# Patient Record
Sex: Female | Born: 1965 | Race: Black or African American | Hispanic: No | Marital: Married | Smoking: Never smoker
Health system: Southern US, Community
[De-identification: ages and names within clinical notes are randomized; demographics above are authoritative.]

## PROBLEM LIST (undated history)

## (undated) DIAGNOSIS — D649 Anemia, unspecified: Secondary | ICD-10-CM

## (undated) HISTORY — PX: ABDOMINAL HYSTERECTOMY: SHX81

## (undated) HISTORY — PX: APPENDECTOMY: SHX54

---

## 2014-09-16 DIAGNOSIS — D259 Leiomyoma of uterus, unspecified: Secondary | ICD-10-CM | POA: Insufficient documentation

## 2014-10-03 DIAGNOSIS — Z90710 Acquired absence of both cervix and uterus: Secondary | ICD-10-CM | POA: Insufficient documentation

## 2016-09-27 ENCOUNTER — Emergency Department (HOSPITAL_COMMUNITY): Payer: Managed Care, Other (non HMO)

## 2016-09-27 ENCOUNTER — Encounter (HOSPITAL_COMMUNITY): Payer: Self-pay

## 2016-09-27 ENCOUNTER — Inpatient Hospital Stay (HOSPITAL_COMMUNITY): Payer: Managed Care, Other (non HMO)

## 2016-09-27 ENCOUNTER — Inpatient Hospital Stay (HOSPITAL_COMMUNITY)
Admission: EM | Admit: 2016-09-27 | Discharge: 2016-09-30 | DRG: 872 | Disposition: A | Discharge status: Home / Self Care | Payer: Managed Care, Other (non HMO) | Attending: Nephrology | Admitting: Nephrology

## 2016-09-27 DIAGNOSIS — D696 Thrombocytopenia, unspecified: Secondary | ICD-10-CM | POA: Diagnosis present

## 2016-09-27 DIAGNOSIS — N10 Acute tubulo-interstitial nephritis: Secondary | ICD-10-CM | POA: Diagnosis present

## 2016-09-27 DIAGNOSIS — N390 Urinary tract infection, site not specified: Secondary | ICD-10-CM | POA: Diagnosis not present

## 2016-09-27 DIAGNOSIS — R112 Nausea with vomiting, unspecified: Secondary | ICD-10-CM | POA: Diagnosis present

## 2016-09-27 DIAGNOSIS — M549 Dorsalgia, unspecified: Secondary | ICD-10-CM | POA: Diagnosis present

## 2016-09-27 DIAGNOSIS — R000 Tachycardia, unspecified: Secondary | ICD-10-CM | POA: Diagnosis present

## 2016-09-27 DIAGNOSIS — A419 Sepsis, unspecified organism: Secondary | ICD-10-CM | POA: Diagnosis not present

## 2016-09-27 DIAGNOSIS — R42 Dizziness and giddiness: Secondary | ICD-10-CM | POA: Diagnosis present

## 2016-09-27 DIAGNOSIS — R319 Hematuria, unspecified: Secondary | ICD-10-CM | POA: Diagnosis present

## 2016-09-27 DIAGNOSIS — I959 Hypotension, unspecified: Secondary | ICD-10-CM | POA: Diagnosis present

## 2016-09-27 LAB — CBC WITH DIFFERENTIAL/PLATELET
Basophils Absolute: 0 10*3/uL (ref 0.0–0.1)
Basophils Relative: 0 %
Eosinophils Absolute: 0 10*3/uL (ref 0.0–0.7)
Eosinophils Relative: 0 %
HEMATOCRIT: 39.6 % (ref 36.0–46.0)
HEMOGLOBIN: 13.4 g/dL (ref 12.0–15.0)
LYMPHS ABS: 1.4 10*3/uL (ref 0.7–4.0)
Lymphocytes Relative: 12 %
MCH: 27.9 pg (ref 26.0–34.0)
MCHC: 33.8 g/dL (ref 30.0–36.0)
MCV: 82.5 fL (ref 78.0–100.0)
MONO ABS: 1.3 10*3/uL — AB (ref 0.1–1.0)
MONOS PCT: 10 %
NEUTROS ABS: 9.6 10*3/uL — AB (ref 1.7–7.7)
NEUTROS PCT: 78 %
Platelets: 139 10*3/uL — ABNORMAL LOW (ref 150–400)
RBC: 4.8 MIL/uL (ref 3.87–5.11)
RDW: 13.1 % (ref 11.5–15.5)
WBC: 12.3 10*3/uL — ABNORMAL HIGH (ref 4.0–10.5)

## 2016-09-27 LAB — COMPREHENSIVE METABOLIC PANEL
ALBUMIN: 3.2 g/dL — AB (ref 3.5–5.0)
ALK PHOS: 56 U/L (ref 38–126)
ALT: 16 U/L (ref 14–54)
ANION GAP: 8 (ref 5–15)
AST: 21 U/L (ref 15–41)
BUN: 8 mg/dL (ref 6–20)
CHLORIDE: 101 mmol/L (ref 101–111)
CO2: 26 mmol/L (ref 22–32)
Calcium: 8.8 mg/dL — ABNORMAL LOW (ref 8.9–10.3)
Creatinine, Ser: 1.25 mg/dL — ABNORMAL HIGH (ref 0.44–1.00)
GFR calc non Af Amer: 49 mL/min — ABNORMAL LOW (ref >60)
GFR, EST AFRICAN AMERICAN: 57 mL/min — AB (ref >60)
GLUCOSE: 131 mg/dL — AB (ref 65–99)
Potassium: 4.3 mmol/L (ref 3.5–5.1)
SODIUM: 135 mmol/L (ref 135–145)
Total Bilirubin: 0.9 mg/dL (ref 0.3–1.2)
Total Protein: 6.7 g/dL (ref 6.5–8.1)

## 2016-09-27 LAB — URINALYSIS, ROUTINE W REFLEX MICROSCOPIC
Bilirubin Urine: NEGATIVE
GLUCOSE, UA: NEGATIVE mg/dL
KETONES UR: NEGATIVE mg/dL
Nitrite: NEGATIVE
PROTEIN: 30 mg/dL — AB
SQUAMOUS EPITHELIAL / LPF: NONE SEEN
Specific Gravity, Urine: 1.006 (ref 1.005–1.030)
pH: 5 (ref 5.0–8.0)

## 2016-09-27 LAB — I-STAT CG4 LACTIC ACID, ED
LACTIC ACID, VENOUS: 1.44 mmol/L (ref 0.5–1.9)
LACTIC ACID, VENOUS: 0.94 mmol/L (ref 0.5–1.9)

## 2016-09-27 MED ORDER — ACETAMINOPHEN 325 MG PO TABS
650.0000 mg | ORAL_TABLET | Freq: Once | ORAL | Status: AC | PRN
  Administered 2016-09-27: 650 mg via ORAL

## 2016-09-27 MED ORDER — KETOROLAC TROMETHAMINE 30 MG/ML IJ SOLN
30.0000 mg | Freq: Once | INTRAMUSCULAR | Status: AC
  Administered 2016-09-27: 30 mg via INTRAVENOUS
  Filled 2016-09-27: qty 1

## 2016-09-27 MED ORDER — ACETAMINOPHEN 325 MG PO TABS
ORAL_TABLET | ORAL | Status: AC
  Filled 2016-09-27: qty 2

## 2016-09-27 MED ORDER — DEXTROSE 5 % IV SOLN
1.0000 g | Freq: Once | INTRAVENOUS | Status: AC
  Administered 2016-09-27: 1 g via INTRAVENOUS
  Filled 2016-09-27: qty 10

## 2016-09-27 MED ORDER — ONDANSETRON HCL 4 MG/2ML IJ SOLN
4.0000 mg | Freq: Once | INTRAMUSCULAR | Status: AC
  Administered 2016-09-27: 4 mg via INTRAVENOUS
  Filled 2016-09-27: qty 2

## 2016-09-27 NOTE — ED Notes (Signed)
Patient taken to CT.

## 2016-09-27 NOTE — H&P (Addendum)
[name] KWI:[id number] DOB: [birth year] DOA: 09/27/2016     PCP: Patient, No Pcp Per   Outpatient Specialists: none Patient coming from:   home Lives With family   Chief Complaint: Nausea vomiting fever suprapubic pain  HPI: Lauren Glenn is a 51 y.o. female with no significant  medical history      Presented with 5-7 days of worsening suprapubic pain dysuria. Today significant hematuria worsening back pain  she presented to [facility] and was diagnosed with kidney infection sent to emergency department to have IV antibiotics she have had associated nausea  patient has recently moved here have not establish PCP care  Symptoms have been getting worse now she started to have fevers as well. Denies hx of UTI's or kidney stones in the past.     IN ER:  Temp (24hrs), Avg:102.8 F (39.3 C), Min:102.8 F (39.3 C), Max:102.8 F (39.3 C)    RR 17 98% Hr 89 down from 104 BP 108/64 up from 95/53 Lactic acid 1.44 Na 135 K 4.3 Cr 1.25 Alb 3.2 WBC 12.3 Hg 13.4 plt 139 CXR negative Following Medications were ordered in ER: Medications  acetaminophen (TYLENOL) tablet 650 mg (650 mg Oral Given 09/27/16 1642)  cefTRIAXone (ROCEPHIN) 1 g in dextrose 5 % 50 mL IVPB (0 g Intravenous Stopped 09/27/16 2041)  ketorolac (TORADOL) 30 MG/ML injection 30 mg (30 mg Intravenous Given 09/27/16 2140)  ondansetron (ZOFRAN) injection 4 mg (4 mg Intravenous Given 09/27/16 2140)     Hospitalist was called for admission forSepsis secondary to Pyelonephritis    Review of Systems:    Pertinent positives include: Fevers, chills, fatigue, abdominal pain, nausea, dysuria, hematuria  Constitutional:  No weight loss, night sweats, weight loss  HEENT:  No headaches, Difficulty swallowing,Tooth/dental problems,Sore throat,  No sneezing, itching, ear ache, nasal congestion, post nasal drip,  Cardio-vascular:  No chest pain, Orthopnea, PND, anasarca, dizziness, palpitations.no Bilateral lower extremity  swelling  GI:  No heartburn, indigestion,  vomiting, diarrhea, change in bowel habits, loss of appetite, melena, blood in stool, hematemesis Resp:  no shortness of breath at rest. No dyspnea on exertion, No excess mucus, no productive cough, No non-productive cough, No coughing up of blood.No change in color of mucus.No wheezing. Skin:  no rash or lesions. No jaundice GU:  no change in color of urine, no urgency or frequency. No straining to urinate.  No flank pain.  Musculoskeletal:  No joint pain or no joint swelling. No decreased range of motion. No back pain.  Psych:  No change in mood or affect. No depression or anxiety. No memory loss.  Neuro: no localizing neurological complaints, no tingling, no weakness, no double vision, no gait abnormality, no slurred speech, no confusion  As per HPI otherwise 10 point review of systems negative.   Past Medical History: History reviewed. No pertinent past medical history. Past Surgical History:  Procedure Laterality Date  . ABDOMINAL HYSTERECTOMY    . APPENDECTOMY       Social History:  Ambulatory  independently     reports that she has never smoked. She has never used smokeless tobacco. She reports that she does not drink alcohol or use drugs.  Allergies:  No Known Allergies     Family History:   Family History  Problem Relation Age of Onset  . Cancer Other   . CAD Neg Hx   . Stroke Neg Hx   . Diabetes Neg Hx     Medications: Prior to  Admission medications   Not on File    Physical Exam: Patient Vitals for the past 24 hrs:  BP Temp Temp src Pulse Resp SpO2  09/27/16 2100 108/64 - - 89 - 98 %  09/27/16 2000 (!) 95/53 - - 80 - 99 %  09/27/16 1954 (!) 111/58 - - 85 17 100 %  09/27/16 1635 104/62 (!) 102.8 F (39.3 C) Oral (!) 104 18 93 %    1. General:  in No Acute distress 2. Psychological: Alert and  Oriented 3. Head/ENT:    Dry Mucous Membranes                          Head Non traumatic, neck supple                           Normal   Dentition 4. SKIN:   decreased Skin turgor,  Skin clean Dry and intact no rash 5. Heart: Regular rate and rhythm no   Murmur, Rub or gallop 6. Lungs:  Clear to auscultation bilaterally, no wheezes or crackles   7. Abdomen: Soft, suprapubic tenderness, Non distended 8. Lower extremities: no clubbing, cyanosis, or edema 9. Neurologically Grossly intact, moving all 4 extremities equally   10. MSK: Normal range of motion   body mass index is unknown because there is no height or weight on file.  Labs on Admission:   Labs on Admission: I have personally reviewed following labs and imaging studies  CBC:  Recent Labs Lab 09/27/16 1600  WBC 12.3*  NEUTROABS 9.6*  HGB 13.4  HCT 39.6  MCV 82.5  PLT 789*   Basic Metabolic Panel:  Recent Labs Lab 09/27/16 1600  NA 135  K 4.3  CL 101  CO2 26  GLUCOSE 131*  BUN 8  CREATININE 1.25*  CALCIUM 8.8*   GFR: CrCl cannot be calculated (Unknown ideal weight.). Liver Function Tests:  Recent Labs Lab 09/27/16 1600  AST 21  ALT 16  ALKPHOS 56  BILITOT 0.9  PROT 6.7  ALBUMIN 3.2*   No results for input(s): LIPASE, AMYLASE in the last 168 hours. No results for input(s): AMMONIA in the last 168 hours. Coagulation Profile: No results for input(s): INR, PROTIME in the last 168 hours. Cardiac Enzymes: No results for input(s): CKTOTAL, CKMB, CKMBINDEX, TROPONINI in the last 168 hours. BNP (last 3 results) No results for input(s): PROBNP in the last 8760 hours. HbA1C: No results for input(s): HGBA1C in the last 72 hours. CBG: No results for input(s): GLUCAP in the last 168 hours. Lipid Profile: No results for input(s): CHOL, HDL, LDLCALC, TRIG, CHOLHDL, LDLDIRECT in the last 72 hours. Thyroid Function Tests: No results for input(s): TSH, T4TOTAL, FREET4, T3FREE, THYROIDAB in the last 72 hours. Anemia Panel: No results for input(s): VITAMINB12, FOLATE, FERRITIN, TIBC, IRON, RETICCTPCT in the last 72  hours. Urine analysis:    Component Value Date/Time   COLORURINE YELLOW 09/27/2016 1954   APPEARANCEUR CLOUDY (A) 09/27/2016 1954   LABSPEC 1.006 09/27/2016 1954   PHURINE 5.0 09/27/2016 1954   GLUCOSEU NEGATIVE 09/27/2016 1954   HGBUR MODERATE (A) 09/27/2016 [place] NEGATIVE 09/27/2016 [place] NEGATIVE 09/27/2016 1954   PROTEINUR 30 (A) 09/27/2016 1954   NITRITE NEGATIVE 09/27/2016 1954   LEUKOCYTESUR LARGE (A) 09/27/2016 1954   Sepsis Labs: @LABRCNTIP (procalcitonin:4,lacticidven:4) ) Recent Results (from the past 240 hour(s))  Culture, blood (routine x 2)  Status: None (Preliminary result)   Collection Time: 09/27/16  7:58 PM  Result Value Ref Range Status   Specimen Description BLOOD RIGHT ARM  Final   Special Requests   Final    BOTTLES DRAWN AEROBIC AND ANAEROBIC Blood Culture adequate volume   Culture PENDING  Incomplete   Report Status PENDING  Incomplete     UA  evidence of UTI      No results found for: HGBA1C  CrCl cannot be calculated (Unknown ideal weight.).  BNP (last 3 results) No results for input(s): PROBNP in the last 8760 hours.   ECG REPORT Not obtained  There were no vitals filed for this visit.   Cultures:    Component Value Date/Time   SDES BLOOD RIGHT ARM 09/27/2016 1958   SPECREQUEST  09/27/2016 1958    BOTTLES DRAWN AEROBIC AND ANAEROBIC Blood Culture adequate volume   CULT PENDING 09/27/2016 1958   REPTSTATUS PENDING 09/27/2016 1958     Radiological Exams on Admission: Dg Chest 2 View  Result Date: 09/27/2016 CLINICAL DATA:  Kidney infection. EXAM: CHEST  2 VIEW COMPARISON:  None. FINDINGS: The heart size and mediastinal contours are within normal limits. Both lungs are clear. Scoliosis deformity involving the thoracic spine is convex towards the right. IMPRESSION: No active cardiopulmonary disease. Electronically Signed   By: Kerby Moors M.D.   On: 09/27/2016 17:27   Ct Renal Stone Study  Result Date:  09/27/2016 CLINICAL DATA:  Urinary tract infection. Hematuria. History of appendectomy and abdominal hysterectomy. EXAM: CT ABDOMEN AND PELVIS WITHOUT CONTRAST TECHNIQUE: Multidetector CT imaging of the abdomen and pelvis was performed following the standard protocol without IV contrast. COMPARISON:  None. FINDINGS: Lower chest: Mild atelectasis in the lung bases. Hepatobiliary: No focal liver abnormality is seen. No gallstones, gallbladder wall thickening, or biliary dilatation. Pancreas: Unremarkable. No pancreatic ductal dilatation or surrounding inflammatory changes. Spleen: Normal in size without focal abnormality. Adrenals/Urinary Tract: No adrenal gland nodules. No hydronephrosis or hydroureter. There is stranding around the right kidney and right ureter with mild diffuse bladder wall thickening. Changes suggest cystitis with right pyelonephritis. No abscess. Stomach/Bowel: Stomach, small bowel, and colon are mostly decompressed. Appendix is surgically absent. Vascular/Lymphatic: No significant vascular findings are present. No enlarged abdominal or pelvic lymph nodes. Reproductive: Status post hysterectomy. No adnexal masses. Other: Minimal midline incisional anterior pelvic wall hernia containing fat. No free air or free fluid in the abdomen. Musculoskeletal: Degenerative changes in the spine. No destructive bone lesions. IMPRESSION: Bladder wall thickening with stranding around the right kidney. Changes suggest pyelonephritis and cystitis. No renal or ureteral stone or obstruction identified. Electronically Signed   By: Lucienne Capers M.D.   On: 09/27/2016 23:36    Chart has been reviewed    Assessment/Plan  51 y.o. female with no significant  medical history  Presents with sepsis likely due to pyelonephritis     Present on Admission: . Acute pyelonephritis obtain CT renal protocol to evaluate for possible abscess vs renal stone Given hypotension will admit to step down . Sepsis secondary  to UTI ([place]) - Admit per Sepsis protocol likely source being  UTI,   - rehydrate with 18ml/kg  - initiate broad spectrum antibiotics Rocephin  -  obtain blood cultures  - Obtain serial lactic acid  - Obtain procalcitonin level  - Admit and monitor vital signs closely  -   Sepsis - Repeat Assessment  Performed at:    10:58 PM  Vitals     Blood  pressure (!) 99/54, pulse 96, temperature (!) 102.8 F (39.3 C), temperature source Oral, resp. rate 17, SpO2 96 %.  Heart:     Regular rate and rhythm  Lungs:    CTA  Capillary Refill:   <2 sec  Peripheral Pulse:   Radial pulse palpable  Skin:     Normal Color      Other plan as per orders.  DVT prophylaxis:  SCD     Code Status:  FULL CODE  as per patient    Family Communication:   Family  at  Bedside  plan of care was discussed with Husband  Disposition Plan:    To home once workup is complete and patient is stable                              Consults called: none  Admission status:    obs   Level of care          SDU      I have spent a total of 33min on this admission   [place] 09/27/2016, 11:47 PM    [facility]  Pager [phone]   after 2 AM please page floor coverage PA If 7AM-7PM, please contact the day team taking care of the patient  [email or web address]  Password [email or web address]

## 2016-09-27 NOTE — ED Triage Notes (Signed)
Pt reports she was sent here by  Dr. Barnett Abu from [facility] for a kidney infection. She states she has noticed hematuria, abdominal pain and back pain today.

## 2016-09-27 NOTE — ED Provider Notes (Signed)
[facility] DEPT Provider Note   CSN: [id number] Arrival date & time: 09/27/16  1624     History   Chief Complaint Chief Complaint  Patient presents with  . Hematuria    HPI Lauren Glenn is a 51 y.o. female.  Patient is a 51 year old female with history of prior hysterectomy. She presents for evaluation of blood in the urine and lower abdominal discomfort. This is been ongoing for the past several days. She was seen at [facility] and told she had a urinary tract infection. She was then sent here for IV antibiotics and possible admission. She denies any vomiting.    Hematuria  This is a new problem. The current episode started 2 days ago. The problem occurs constantly. The problem has been gradually worsening. Associated symptoms include abdominal pain. Nothing aggravates the symptoms. Nothing relieves the symptoms. She has tried nothing for the symptoms.    History reviewed. No pertinent past medical history.  There are no active problems to display for this patient.   Past Surgical History:  Procedure Laterality Date  . ABDOMINAL HYSTERECTOMY    . APPENDECTOMY      OB History    No data available       Home Medications    Prior to Admission medications   Not on File    Family History No family history on file.  Social History Social History  Substance Use Topics  . Smoking status: Never Smoker  . Smokeless tobacco: Never Used  . Alcohol use No     Allergies   Patient has no allergy information on record.   Review of Systems Review of Systems  Gastrointestinal: Positive for abdominal pain.  Genitourinary: Positive for hematuria.  All other systems reviewed and are negative.    Physical Exam Updated Vital Signs BP (!) 111/58 (BP Location: Left Arm)   Pulse 85   Temp (!) 102.8 F (39.3 C) (Oral)   Resp 17   SpO2 100%   Physical Exam  Constitutional: She is oriented to person, place, and time. She appears well-developed and  well-nourished. No distress.  HENT:  Head: Normocephalic and atraumatic.  Neck: Normal range of motion. Neck supple.  Cardiovascular: Normal rate and regular rhythm.  Exam reveals no gallop and no friction rub.   No murmur heard. Pulmonary/Chest: Effort normal and breath sounds normal. No respiratory distress. She has no wheezes.  Abdominal: Soft. Bowel sounds are normal. She exhibits no distension. There is tenderness. There is no rebound and no guarding.  There is mild tenderness in the suprapubic region.  Musculoskeletal: Normal range of motion.  Neurological: She is alert and oriented to person, place, and time.  Skin: Skin is warm and dry. She is not diaphoretic.  Nursing note and vitals reviewed.    ED Treatments / Results  Labs (all labs ordered are listed, but only abnormal results are displayed) Labs Reviewed  COMPREHENSIVE METABOLIC PANEL - Abnormal; Notable for the following:       Result Value   Glucose, Bld 131 (*)    Creatinine, Ser 1.25 (*)    Calcium 8.8 (*)    Albumin 3.2 (*)    GFR calc non Af Amer 49 (*)    GFR calc Af Amer 57 (*)    All other components within normal limits  CBC WITH DIFFERENTIAL/PLATELET - Abnormal; Notable for the following:    WBC 12.3 (*)    Platelets 139 (*)    Neutro Abs 9.6 (*)  Monocytes Absolute 1.3 (*)    All other components within normal limits  CULTURE, BLOOD (ROUTINE X 2)  CULTURE, BLOOD (ROUTINE X 2)  URINALYSIS, ROUTINE W REFLEX MICROSCOPIC  I-STAT CG4 LACTIC ACID, ED  I-STAT CG4 LACTIC ACID, ED  I-STAT CG4 LACTIC ACID, ED    EKG  EKG Interpretation None       Radiology Dg Chest 2 View  Result Date: 09/27/2016 CLINICAL DATA:  Kidney infection. EXAM: CHEST  2 VIEW COMPARISON:  None. FINDINGS: The heart size and mediastinal contours are within normal limits. Both lungs are clear. Scoliosis deformity involving the thoracic spine is convex towards the right. IMPRESSION: No active cardiopulmonary disease.  Electronically Signed   By: Kerby Moors M.D.   On: 09/27/2016 17:27    Procedures Procedures (including critical care time)  Medications Ordered in ED Medications  acetaminophen (TYLENOL) 325 MG tablet (not administered)  cefTRIAXone (ROCEPHIN) 1 g in dextrose 5 % 50 mL IVPB (not administered)  acetaminophen (TYLENOL) tablet 650 mg (650 mg Oral Given 09/27/16 1642)     Initial Impression / Assessment and Plan / ED Course  I have reviewed the triage vital signs and the nursing notes.  Pertinent labs & imaging results that were available during my care of the patient were reviewed by me and considered in my medical decision making (see chart for details).  Patient sent from [facility] hospital for admission for treatment of urinary tract infection. She was evaluated there and found to have a UTI, elevated white count.  Urinalysis shows a UTI and labs show a white count of 12.4. She was given Rocephin here in the ER and will be admitted to the hospitalist service. Dr. Roel Cluck agrees to admit. She was given the option for discharge with oral antibiotics, however she reports that she feels too nauseated and too sick to go home.  Final Clinical Impressions(s) / ED Diagnoses   Final diagnoses:  None    New Prescriptions New Prescriptions   No medications on file     Veryl Speak, MD 09/27/16 2323

## 2016-09-28 DIAGNOSIS — R000 Tachycardia, unspecified: Secondary | ICD-10-CM | POA: Diagnosis present

## 2016-09-28 DIAGNOSIS — I959 Hypotension, unspecified: Secondary | ICD-10-CM | POA: Diagnosis present

## 2016-09-28 DIAGNOSIS — N390 Urinary tract infection, site not specified: Secondary | ICD-10-CM | POA: Diagnosis not present

## 2016-09-28 DIAGNOSIS — A419 Sepsis, unspecified organism: Secondary | ICD-10-CM | POA: Diagnosis present

## 2016-09-28 DIAGNOSIS — R42 Dizziness and giddiness: Secondary | ICD-10-CM | POA: Diagnosis present

## 2016-09-28 DIAGNOSIS — R112 Nausea with vomiting, unspecified: Secondary | ICD-10-CM | POA: Diagnosis present

## 2016-09-28 DIAGNOSIS — N10 Acute tubulo-interstitial nephritis: Secondary | ICD-10-CM | POA: Diagnosis present

## 2016-09-28 DIAGNOSIS — D696 Thrombocytopenia, unspecified: Secondary | ICD-10-CM | POA: Diagnosis present

## 2016-09-28 DIAGNOSIS — M549 Dorsalgia, unspecified: Secondary | ICD-10-CM | POA: Diagnosis present

## 2016-09-28 DIAGNOSIS — R319 Hematuria, unspecified: Secondary | ICD-10-CM | POA: Diagnosis present

## 2016-09-28 LAB — LACTIC ACID, PLASMA
LACTIC ACID, VENOUS: 1 mmol/L (ref 0.5–1.9)
Lactic Acid, Venous: 1.2 mmol/L (ref 0.5–1.9)

## 2016-09-28 LAB — CBC WITH DIFFERENTIAL/PLATELET
BASOS PCT: 0 %
Eosinophils Relative: 0 %
HCT: 38 % (ref 36.0–46.0)
HEMOGLOBIN: 13 g/dL (ref 12.0–15.0)
LYMPHS PCT: 13 %
MCH: 28.4 pg (ref 26.0–34.0)
MCHC: 34.2 g/dL (ref 30.0–36.0)
MCV: 83 fL (ref 78.0–100.0)
Monocytes Relative: 9 %
NEUTROS PCT: 78 %
Platelets: 120 10*3/uL — ABNORMAL LOW (ref 150–400)
RBC: 4.58 MIL/uL (ref 3.87–5.11)
RDW: 13.5 % (ref 11.5–15.5)
WBC: 10.8 10*3/uL — AB (ref 4.0–10.5)

## 2016-09-28 LAB — COMPREHENSIVE METABOLIC PANEL
ALT: 13 U/L — ABNORMAL LOW (ref 14–54)
AST: 22 U/L (ref 15–41)
Albumin: 3 g/dL — ABNORMAL LOW (ref 3.5–5.0)
Alkaline Phosphatase: 54 U/L (ref 38–126)
Anion gap: 9 (ref 5–15)
BILIRUBIN TOTAL: 1.3 mg/dL — AB (ref 0.3–1.2)
BUN: 9 mg/dL (ref 6–20)
CO2: 23 mmol/L (ref 22–32)
Calcium: 7.8 mg/dL — ABNORMAL LOW (ref 8.9–10.3)
Chloride: 103 mmol/L (ref 101–111)
Creatinine, Ser: 1.34 mg/dL — ABNORMAL HIGH (ref 0.44–1.00)
GFR, EST AFRICAN AMERICAN: 52 mL/min — AB (ref >60)
GFR, EST NON AFRICAN AMERICAN: 45 mL/min — AB (ref >60)
Glucose, Bld: 116 mg/dL — ABNORMAL HIGH (ref 65–99)
POTASSIUM: 4.9 mmol/L (ref 3.5–5.1)
Sodium: 135 mmol/L (ref 135–145)
TOTAL PROTEIN: 5.9 g/dL — AB (ref 6.5–8.1)

## 2016-09-28 LAB — GLUCOSE, CAPILLARY
GLUCOSE-CAPILLARY: 101 mg/dL — AB (ref 65–99)
GLUCOSE-CAPILLARY: 99 mg/dL (ref 65–99)
Glucose-Capillary: 107 mg/dL — ABNORMAL HIGH (ref 65–99)

## 2016-09-28 LAB — TSH: TSH: 1.364 u[IU]/mL (ref 0.350–4.500)

## 2016-09-28 LAB — PROTIME-INR
INR: 1.21
PROTHROMBIN TIME: 15.3 s — AB (ref 11.4–15.2)

## 2016-09-28 LAB — PROCALCITONIN: Procalcitonin: 4.73 ng/mL

## 2016-09-28 LAB — MRSA PCR SCREENING: MRSA BY PCR: NEGATIVE

## 2016-09-28 LAB — HIV ANTIBODY (ROUTINE TESTING W REFLEX): HIV Screen 4th Generation wRfx: NONREACTIVE

## 2016-09-28 LAB — APTT: aPTT: 37 seconds — ABNORMAL HIGH (ref 24–36)

## 2016-09-28 LAB — MAGNESIUM: MAGNESIUM: 1.8 mg/dL (ref 1.7–2.4)

## 2016-09-28 LAB — PHOSPHORUS: PHOSPHORUS: 1.9 mg/dL — AB (ref 2.5–4.6)

## 2016-09-28 LAB — PREGNANCY, URINE: PREG TEST UR: NEGATIVE

## 2016-09-28 MED ORDER — SODIUM CHLORIDE 0.9% FLUSH
3.0000 mL | Freq: Two times a day (BID) | INTRAVENOUS | Status: DC
  Administered 2016-09-29 – 2016-09-30 (×3): 3 mL via INTRAVENOUS

## 2016-09-28 MED ORDER — SODIUM CHLORIDE 0.9 % IV SOLN
INTRAVENOUS | Status: DC
  Administered 2016-09-28: 01:00:00 via INTRAVENOUS

## 2016-09-28 MED ORDER — KETOROLAC TROMETHAMINE 30 MG/ML IJ SOLN
30.0000 mg | Freq: Four times a day (QID) | INTRAMUSCULAR | Status: DC | PRN
  Administered 2016-09-28: 30 mg via INTRAVENOUS
  Filled 2016-09-28: qty 1

## 2016-09-28 MED ORDER — HYDROCODONE-ACETAMINOPHEN 5-325 MG PO TABS: 1.0000 | ORAL_TABLET | ORAL | Status: DC | PRN

## 2016-09-28 MED ORDER — SODIUM CHLORIDE 0.9 % IV SOLN
INTRAVENOUS | Status: DC
  Administered 2016-09-28 – 2016-09-29 (×3): via INTRAVENOUS

## 2016-09-28 MED ORDER — SODIUM CHLORIDE 0.9 % IV BOLUS (SEPSIS)
1000.0000 mL | Freq: Once | INTRAVENOUS | Status: AC
  Administered 2016-09-28: 1000 mL via INTRAVENOUS

## 2016-09-28 MED ORDER — MAGNESIUM SULFATE 2 GM/50ML IV SOLN
2.0000 g | Freq: Once | INTRAVENOUS | Status: AC
  Administered 2016-09-28: 2 g via INTRAVENOUS
  Filled 2016-09-28: qty 50

## 2016-09-28 MED ORDER — ONDANSETRON HCL 4 MG/2ML IJ SOLN
4.0000 mg | Freq: Four times a day (QID) | INTRAMUSCULAR | Status: DC | PRN
  Administered 2016-09-28: 4 mg via INTRAVENOUS
  Filled 2016-09-28: qty 2

## 2016-09-28 MED ORDER — ONDANSETRON HCL 4 MG PO TABS: 4.0000 mg | ORAL_TABLET | Freq: Four times a day (QID) | ORAL | Status: DC | PRN

## 2016-09-28 MED ORDER — DEXTROSE 5 % IV SOLN
1.0000 g | INTRAVENOUS | Status: DC
  Administered 2016-09-29 – 2016-09-30 (×2): 1 g via INTRAVENOUS
  Filled 2016-09-28 (×2): qty 10

## 2016-09-28 MED ORDER — ACETAMINOPHEN 325 MG PO TABS
650.0000 mg | ORAL_TABLET | Freq: Four times a day (QID) | ORAL | Status: DC | PRN
  Administered 2016-09-28 (×2): 650 mg via ORAL
  Filled 2016-09-28 (×2): qty 2

## 2016-09-28 MED ORDER — SODIUM CHLORIDE 0.9 % IV BOLUS (SEPSIS)
500.0000 mL | Freq: Once | INTRAVENOUS | Status: AC
  Administered 2016-09-28: 500 mL via INTRAVENOUS

## 2016-09-28 MED ORDER — ACETAMINOPHEN 650 MG RE SUPP
650.0000 mg | Freq: Four times a day (QID) | RECTAL | Status: DC | PRN
Start: 2016-09-28 — End: 2016-09-30

## 2016-09-28 NOTE — Progress Notes (Signed)
Pharmacy Antibiotic Note  Lauren Glenn is a 51 y.o. female admitted on 09/27/2016 with UTI.  Pharmacy has been consulted for ceftriaxone dosing. WBC 12.3, Temp 102.8, UA (+)  Plan: Ceftriaxone 1g IV q24 hours F/U clinical progression and c/s     Temp (24hrs), Avg:102.8 F (39.3 C), Min:102.8 F (39.3 C), Max:102.8 F (39.3 C)   Recent Labs Lab 09/27/16 1600 09/27/16 1718 09/27/16 2011  WBC 12.3*  --   --   CREATININE 1.25*  --   --   LATICACIDVEN  --  1.44 0.94    CrCl cannot be calculated (Unknown ideal weight.).    No Known Allergies   Thank you for allowing pharmacy to be a part of this patient's care.  Jodean Lima Ryo Klang 09/28/2016 12:31 AM

## 2016-09-28 NOTE — Progress Notes (Signed)
PROGRESS NOTE    Lauren Glenn  ZOX:[id number] DOB: [birth year] DOA: 09/27/2016 PCP: Patient, No Pcp Per    Brief Narrative: Patient is a 51 year old female presented to the ED with fever, chills, nausea, vomiting, significant suprapubic pain, flank pain. Urinalysis worrisome for UTI. Patient has been pancultured. Patient placed empirically on IV Rocephin. Follow.   Assessment & Plan:   Principal Problem:   Sepsis secondary to UTI [facility]) Active Problems:   Acute pyelonephritis   Sepsis ([place])   Hypotension  #1 sepsis secondary to pyelonephritis and UTI. Patient presented with dysuria, nausea, emesis, CVA tenderness, suprapubic abdominal pain with urinalysis consistent with a UTI. CT abdomen and pelvis stone protocol consistent with acute pyelonephritis. Patient met criteria for sepsis with fever, hypotension, tachycardia, leukocytosis. Patient still with some dizziness. Patient with no emesis however significant lower abdominal pain/suprapubic pain and CVA tenderness. Patient has been pancultured and results pending. Continue empiric IV Rocephin, IV fluids, pain management, supportive care.  #2 hypotension Likely secondary to acute infection versus dehydration. Patient has been pancultured results pending. Patient with no chest pain. Given a bolus of normal saline 1 L 1. IV fluids at 125 mL per hour. Follow.   DVT prophylaxis: SCDs. Code Status: Full Family Communication: Updated patient. No family at bedside. Disposition Plan: Remain the step down unit today. Home when afebrile, resolution of hypotension, clinical improvement with CVA tenderness and super pubic abdominal pain, final isolation of cultures and sensitivities, tolerating oral solid intake.   Consultants:   None  Procedures:   CT stone protocol 09/27/2016  Chest x-ray 09/27/2016  Antimicrobials:   IV Rocephin 09/27/2016   Subjective: Patient complaining of some dizziness on ambulation. Patient with complaints  of suprapubic pain and CVA tenderness. Patient denies any emesis. Patient denies any dysuria. Patient tolerating clears. Patient will like to remain on clears today. Patient asking whether she can go home.  Objective: Vitals:   09/28/16 0033 09/28/16 0407 09/28/16 0531 09/28/16 0759  BP: 90/60 106/78  (!) 94/52  Pulse: 87 (!) 109 (!) 102 83  Resp: 18 (!) 22 (!) 34 20  Temp: 98.2 F (36.8 C) (!) 102.5 F (39.2 C) (!) 102.8 F (39.3 C) 99.1 F (37.3 C)  TempSrc: Oral Oral Oral Oral  SpO2: 100% 100% 100% 100%  Weight: 69.9 kg (154 lb)     Height: _0  (1.626 m)       Intake/Output Summary (Last 24 hours) at 09/28/16 0948 Last data filed at 09/28/16 0813  Gross per 24 hour  Intake              300 ml  Output              250 ml  Net               50 ml   Filed Weights   09/28/16 0033  Weight: 69.9 kg (154 lb)    Examination:  General exam: Appears calm and comfortable  Respiratory system: Clear to auscultation. Respiratory effort normal. Cardiovascular system: S1 & S2 heard, RRR. No JVD, murmurs, rubs, gallops or clicks. No pedal edema. Gastrointestinal system: Abdomen is diffuse TTP greater in the suprapubic region. No organomegaly or masses felt. Normal bowel sounds heard. Positive bilateral CVA tenderness right greater than left. Central nervous system: Alert and oriented. No focal neurological deficits. Extremities: Symmetric 5 x 5 power. Skin: No rashes, lesions or ulcers Psychiatry: Judgement and insight appear normal. Mood & affect appropriate.  Data Reviewed: I have personally reviewed following labs and imaging studies  CBC:  Recent Labs Lab 09/27/16 1600 09/28/16 0231  WBC 12.3* 10.8*  NEUTROABS 9.6*  --   HGB 13.4 13.0  HCT 39.6 38.0  MCV 82.5 83.0  PLT 139* 675*   Basic Metabolic Panel:  Recent Labs Lab 09/27/16 1600 09/28/16 0231  NA 135 135  K 4.3 4.9  CL 101 103  CO2 26 23  GLUCOSE 131* 116*  BUN 8 9  CREATININE 1.25* 1.34*    CALCIUM 8.8* 7.8*  MG  --  1.8  PHOS  --  1.9*   GFR: Estimated Creatinine Clearance: 47.7 mL/min (A) (by C-G formula based on SCr of 1.34 mg/dL (H)). Liver Function Tests:  Recent Labs Lab 09/27/16 1600 09/28/16 0231  AST 21 22  ALT 16 13*  ALKPHOS 56 54  BILITOT 0.9 1.3*  PROT 6.7 5.9*  ALBUMIN 3.2* 3.0*   No results for input(s): LIPASE, AMYLASE in the last 168 hours. No results for input(s): AMMONIA in the last 168 hours. Coagulation Profile:  Recent Labs Lab 09/28/16 0512  INR 1.21   Cardiac Enzymes: No results for input(s): CKTOTAL, CKMB, CKMBINDEX, TROPONINI in the last 168 hours. BNP (last 3 results) No results for input(s): PROBNP in the last 8760 hours. HbA1C: No results for input(s): HGBA1C in the last 72 hours. CBG:  Recent Labs Lab 09/28/16 0837  GLUCAP 101*   Lipid Profile: No results for input(s): CHOL, HDL, LDLCALC, TRIG, CHOLHDL, LDLDIRECT in the last 72 hours. Thyroid Function Tests:  Recent Labs  09/28/16 0231  TSH 1.364   Anemia Panel: No results for input(s): VITAMINB12, FOLATE, FERRITIN, TIBC, IRON, RETICCTPCT in the last 72 hours. Sepsis Labs:  Recent Labs Lab 09/27/16 1718 09/27/16 2011 09/28/16 0231 09/28/16 0512  PROCALCITON  --   --   --  4.73  LATICACIDVEN 1.44 0.94 1.0 1.2    Recent Results (from the past 240 hour(s))  Culture, blood (routine x 2)     Status: None (Preliminary result)   Collection Time: 09/27/16  7:58 PM  Result Value Ref Range Status   Specimen Description BLOOD RIGHT ARM  Final   Special Requests   Final    BOTTLES DRAWN AEROBIC AND ANAEROBIC Blood Culture adequate volume   Culture PENDING  Incomplete   Report Status PENDING  Incomplete  MRSA PCR Screening     Status: None   Collection Time: 09/28/16 12:37 AM  Result Value Ref Range Status   MRSA by PCR NEGATIVE NEGATIVE Final    Comment:        The GeneXpert MRSA Assay (FDA approved for NASAL specimens only), is one component of  a comprehensive MRSA colonization surveillance program. It is not intended to diagnose MRSA infection nor to guide or monitor treatment for MRSA infections.          Radiology Studies: Dg Chest 2 View  Result Date: 09/27/2016 CLINICAL DATA:  Kidney infection. EXAM: CHEST  2 VIEW COMPARISON:  None. FINDINGS: The heart size and mediastinal contours are within normal limits. Both lungs are clear. Scoliosis deformity involving the thoracic spine is convex towards the right. IMPRESSION: No active cardiopulmonary disease. Electronically Signed   By: Kerby Moors M.D.   On: 09/27/2016 17:27   Ct Renal Stone Study  Result Date: 09/27/2016 CLINICAL DATA:  Urinary tract infection. Hematuria. History of appendectomy and abdominal hysterectomy. EXAM: CT ABDOMEN AND PELVIS WITHOUT CONTRAST TECHNIQUE: Multidetector CT imaging of the  abdomen and pelvis was performed following the standard protocol without IV contrast. COMPARISON:  None. FINDINGS: Lower chest: Mild atelectasis in the lung bases. Hepatobiliary: No focal liver abnormality is seen. No gallstones, gallbladder wall thickening, or biliary dilatation. Pancreas: Unremarkable. No pancreatic ductal dilatation or surrounding inflammatory changes. Spleen: Normal in size without focal abnormality. Adrenals/Urinary Tract: No adrenal gland nodules. No hydronephrosis or hydroureter. There is stranding around the right kidney and right ureter with mild diffuse bladder wall thickening. Changes suggest cystitis with right pyelonephritis. No abscess. Stomach/Bowel: Stomach, small bowel, and colon are mostly decompressed. Appendix is surgically absent. Vascular/Lymphatic: No significant vascular findings are present. No enlarged abdominal or pelvic lymph nodes. Reproductive: Status post hysterectomy. No adnexal masses. Other: Minimal midline incisional anterior pelvic wall hernia containing fat. No free air or free fluid in the abdomen. Musculoskeletal:  Degenerative changes in the spine. No destructive bone lesions. IMPRESSION: Bladder wall thickening with stranding around the right kidney. Changes suggest pyelonephritis and cystitis. No renal or ureteral stone or obstruction identified. Electronically Signed   By: Lucienne Capers M.D.   On: 09/27/2016 23:36        Scheduled Meds: . sodium chloride flush  3 mL Intravenous Q12H   Continuous Infusions: . sodium chloride 125 mL/hr at 09/28/16 0030  . [START ON 09/29/2016] cefTRIAXone (ROCEPHIN)  IV       LOS: 1 day    Time spent: 51 minutes    Kino Dunsworth, MD [facility] Pager [phone]  If 7PM-7AM, please contact night-coverage [email or web address] Password [email or web address] 09/28/2016, 9:48 AM

## 2016-09-28 NOTE — Care Management Note (Addendum)
Case Management Note  Patient Details  Name: Lauren Glenn MRN: [id number] Date of Birth: 10-23-1965  Subjective/Objective:   From home with spouse,  Presents with sepsis secondary to UTI, acute pyelonephritis, hypotension. She has medication coverage and she does not have a PCP,  NCM will give patient [facility] information to help assist in getting her a PCP. NCM scheduled follow up apt with [facility] physicians.                 Action/Plan: PTA , indep, NCM will cont to follow for dc needs.   Expected Discharge Date:                  Expected Discharge Plan:  Home/Self Care  In-House Referral:     Discharge planning Services  CM Consult  Post Acute Care Choice:    Choice offered to:     DME Arranged:    DME Agency:     HH Arranged:    [place] Agency:     Status of Service:  Completed, signed off  If discussed at [employer] of Stay Meetings, dates discussed:    Additional Comments:  Zenon Mayo, RN 09/28/2016, 2:40 PM

## 2016-09-28 NOTE — ED Notes (Signed)
Patient is stable and ready to be transport to the floor at this time.  Report was called to 4E RN.  Belongings taken with the patient to the floor.   

## 2016-09-29 LAB — CBC
HEMATOCRIT: 35 % — AB (ref 36.0–46.0)
HEMOGLOBIN: 11.7 g/dL — AB (ref 12.0–15.0)
MCH: 27.8 pg (ref 26.0–34.0)
MCHC: 33.4 g/dL (ref 30.0–36.0)
MCV: 83.1 fL (ref 78.0–100.0)
Platelets: 132 10*3/uL — ABNORMAL LOW (ref 150–400)
RBC: 4.21 MIL/uL (ref 3.87–5.11)
RDW: 13.7 % (ref 11.5–15.5)
WBC: 7.6 10*3/uL (ref 4.0–10.5)

## 2016-09-29 LAB — BASIC METABOLIC PANEL
ANION GAP: 6 (ref 5–15)
BUN: 6 mg/dL (ref 6–20)
CHLORIDE: 110 mmol/L (ref 101–111)
CO2: 23 mmol/L (ref 22–32)
CREATININE: 0.94 mg/dL (ref 0.44–1.00)
Calcium: 7.5 mg/dL — ABNORMAL LOW (ref 8.9–10.3)
GFR calc non Af Amer: >60 mL/min (ref >60)
GLUCOSE: 100 mg/dL — AB (ref 65–99)
Potassium: 4.3 mmol/L (ref 3.5–5.1)
Sodium: 139 mmol/L (ref 135–145)

## 2016-09-29 LAB — MAGNESIUM: MAGNESIUM: 2.4 mg/dL (ref 1.7–2.4)

## 2016-09-29 NOTE — Progress Notes (Signed)
PROGRESS NOTE    Lauren Glenn  YPP:[id number] DOB: 25-Apr-1966 DOA: 09/27/2016 PCP: Patient, No Pcp Per   Brief Narrative: 51 year old female presented with fever and chills and nausea vomiting and significant supra pubic pain. Found to have acute pyelonephritis.  Assessment & Plan:   #Sepsis due to acute pyelonephritis: Patient is clinically improved. I will start soft diet today. Discontinue IV fluid since blood pressure improved. Continue IV ceftriaxone. Follow-up blood culture result. Apparently urine culture was not sent. I ordered urine culture from previous urine sample if available. Since patient is clinically improving on current antibiotics I'll continue it.  #Hypotension: Blood pressure improved. Continue to monitor.  #Mild thrombocytopenia: Monitor platelet count. No sign of bleeding. Recommended outpatient follow-up.  DVT prophylaxis: SCD. Patient has thrombocytopenia. Code Status: Full code Family Communication: No family present Disposition Plan: Likely discharge home tomorrow.    Consultants:   None  Procedures: None Antimicrobials: IV ceftriaxone since May 7  Subjective: Patient was seen and examined at bedside. No nausea vomiting chest pain or shortness of breath.  Objective: Vitals:   09/29/16 0500 09/29/16 0600 09/29/16 0743 09/29/16 1228  BP: 109/67 119/72 132/73 113/70  Pulse: 82 87 81 78  Resp: (!) 25 (!) 23 (!) 23 (!) 24  Temp:   98.8 F (37.1 C) 98.8 F (37.1 C)  TempSrc:   Oral Oral  SpO2: 100% 100% 100% 100%  Weight:      Height:        Intake/Output Summary (Last 24 hours) at 09/29/16 1525 Last data filed at 09/29/16 0700  Gross per 24 hour  Intake             2243 ml  Output                0 ml  Net             2243 ml   Filed Weights   09/28/16 0033  Weight: 69.9 kg (154 lb)    Examination:  General exam: Appears calm and comfortable  Respiratory system: Clear to auscultation. Respiratory effort normal. No wheezing or  crackle Cardiovascular system: S1 & S2 heard, RRR.  No pedal edema. Gastrointestinal system: Abdomen is nondistended, soft and nontender. Normal bowel sounds heard. Central nervous system: Alert and oriented. No focal neurological deficits. Extremities: Symmetric 5 x 5 power. Skin: No rashes, lesions or ulcers Psychiatry: Judgement and insight appear normal. Mood & affect appropriate.     Data Reviewed: I have personally reviewed following labs and imaging studies  CBC:  Recent Labs Lab 09/27/16 1600 09/28/16 0231 09/29/16 0209  WBC 12.3* 10.8* 7.6  NEUTROABS 9.6*  --   --   HGB 13.4 13.0 11.7*  HCT 39.6 38.0 35.0*  MCV 82.5 83.0 83.1  PLT 139* 120* 458*   Basic Metabolic Panel:  Recent Labs Lab 09/27/16 1600 09/28/16 0231 09/29/16 0209  NA 135 135 139  K 4.3 4.9 4.3  CL 101 103 110  CO2 26 23 23   GLUCOSE 131* 116* 100*  BUN 8 9 6   CREATININE 1.25* 1.34* 0.94  CALCIUM 8.8* 7.8* 7.5*  MG  --  1.8 2.4  PHOS  --  1.9*  --    GFR: Estimated Creatinine Clearance: 68 mL/min (by C-G formula based on SCr of 0.94 mg/dL). Liver Function Tests:  Recent Labs Lab 09/27/16 1600 09/28/16 0231  AST 21 22  ALT 16 13*  ALKPHOS 56 54  BILITOT 0.9 1.3*  PROT 6.7 5.9*  ALBUMIN 3.2* 3.0*   No results for input(s): LIPASE, AMYLASE in the last 168 hours. No results for input(s): AMMONIA in the last 168 hours. Coagulation Profile:  Recent Labs Lab 09/28/16 0512  INR 1.21   Cardiac Enzymes: No results for input(s): CKTOTAL, CKMB, CKMBINDEX, TROPONINI in the last 168 hours. BNP (last 3 results) No results for input(s): PROBNP in the last 8760 hours. HbA1C: No results for input(s): HGBA1C in the last 72 hours. CBG:  Recent Labs Lab 09/28/16 0837 09/28/16 1156 09/28/16 1629  GLUCAP 101* 99 107*   Lipid Profile: No results for input(s): CHOL, HDL, LDLCALC, TRIG, CHOLHDL, LDLDIRECT in the last 72 hours. Thyroid Function Tests:  Recent Labs  09/28/16 0231   TSH 1.364   Anemia Panel: No results for input(s): VITAMINB12, FOLATE, FERRITIN, TIBC, IRON, RETICCTPCT in the last 72 hours. Sepsis Labs:  Recent Labs Lab 09/27/16 1718 09/27/16 2011 09/28/16 0231 09/28/16 0512  PROCALCITON  --   --   --  4.73  LATICACIDVEN 1.44 0.94 1.0 1.2    Recent Results (from the past 240 hour(s))  Culture, blood (routine x 2)     Status: None (Preliminary result)   Collection Time: 09/27/16  7:58 PM  Result Value Ref Range Status   Specimen Description BLOOD RIGHT ARM  Final   Special Requests   Final    BOTTLES DRAWN AEROBIC AND ANAEROBIC Blood Culture adequate volume   Culture NO GROWTH 2 DAYS  Final   Report Status PENDING  Incomplete  Culture, blood (routine x 2)     Status: None (Preliminary result)   Collection Time: 09/27/16  8:00 PM  Result Value Ref Range Status   Specimen Description BLOOD RIGHT FOREARM  Final   Special Requests IN PEDIATRIC BOTTLE Blood Culture adequate volume  Final   Culture NO GROWTH 2 DAYS  Final   Report Status PENDING  Incomplete  MRSA PCR Screening     Status: None   Collection Time: 09/28/16 12:37 AM  Result Value Ref Range Status   MRSA by PCR NEGATIVE NEGATIVE Final    Comment:        The GeneXpert MRSA Assay (FDA approved for NASAL specimens only), is one component of a comprehensive MRSA colonization surveillance program. It is not intended to diagnose MRSA infection nor to guide or monitor treatment for MRSA infections.          Radiology Studies: Dg Chest 2 View  Result Date: 09/27/2016 CLINICAL DATA:  Kidney infection. EXAM: CHEST  2 VIEW COMPARISON:  None. FINDINGS: The heart size and mediastinal contours are within normal limits. Both lungs are clear. Scoliosis deformity involving the thoracic spine is convex towards the right. IMPRESSION: No active cardiopulmonary disease. Electronically Signed   By: Kerby Moors M.D.   On: 09/27/2016 17:27   Ct Renal Stone Study  Result Date:  09/27/2016 CLINICAL DATA:  Urinary tract infection. Hematuria. History of appendectomy and abdominal hysterectomy. EXAM: CT ABDOMEN AND PELVIS WITHOUT CONTRAST TECHNIQUE: Multidetector CT imaging of the abdomen and pelvis was performed following the standard protocol without IV contrast. COMPARISON:  None. FINDINGS: Lower chest: Mild atelectasis in the lung bases. Hepatobiliary: No focal liver abnormality is seen. No gallstones, gallbladder wall thickening, or biliary dilatation. Pancreas: Unremarkable. No pancreatic ductal dilatation or surrounding inflammatory changes. Spleen: Normal in size without focal abnormality. Adrenals/Urinary Tract: No adrenal gland nodules. No hydronephrosis or hydroureter. There is stranding around the right kidney and right ureter with mild diffuse bladder wall  thickening. Changes suggest cystitis with right pyelonephritis. No abscess. Stomach/Bowel: Stomach, small bowel, and colon are mostly decompressed. Appendix is surgically absent. Vascular/Lymphatic: No significant vascular findings are present. No enlarged abdominal or pelvic lymph nodes. Reproductive: Status post hysterectomy. No adnexal masses. Other: Minimal midline incisional anterior pelvic wall hernia containing fat. No free air or free fluid in the abdomen. Musculoskeletal: Degenerative changes in the spine. No destructive bone lesions. IMPRESSION: Bladder wall thickening with stranding around the right kidney. Changes suggest pyelonephritis and cystitis. No renal or ureteral stone or obstruction identified. Electronically Signed   By: Lucienne Capers M.D.   On: 09/27/2016 23:36        Scheduled Meds: . sodium chloride flush  3 mL Intravenous Q12H   Continuous Infusions: . cefTRIAXone (ROCEPHIN)  IV Stopped (09/29/16 0751)     LOS: 2 days    Dron Tanna Furry, MD [facility] Pager [phone]  If 7PM-7AM, please contact night-coverage [email or web address] Password [email or web address] 09/29/2016, 3:25 PM

## 2016-09-29 NOTE — Plan of Care (Signed)
Problem: Physical Regulation: Goal: Ability to maintain clinical measurements within normal limits will improve Outcome: Progressing Patient had a fever of 101.9, relieved by tylenol. When patient was having fever she was shaking/shivering.   Problem: Nutrition: Goal: Adequate nutrition will be maintained Outcome: Progressing Patient still on clear liquids but tolerating well.

## 2016-09-30 MED ORDER — LEVOFLOXACIN 500 MG PO TABS: 500.0000 mg | ORAL_TABLET | Freq: Every day | ORAL | 0 refills | Status: AC

## 2016-09-30 MED ORDER — ONDANSETRON HCL 4 MG PO TABS: 4.0000 mg | ORAL_TABLET | Freq: Four times a day (QID) | ORAL | 0 refills | Status: DC | PRN

## 2016-09-30 NOTE — Plan of Care (Signed)
Problem: Nutrition: Goal: Adequate nutrition will be maintained Outcome: Progressing Patient is vegetarian- preferring soft diet with soups. Appetite improving slowly.  Problem: Urinary Elimination: Goal: Signs and symptoms of infection will decrease Outcome: Completed/Met Date Met: 09/30/16 Patient feeling more like herself; slight pain in RLQ, but improving.

## 2016-09-30 NOTE — Progress Notes (Signed)
Discharge information given and reviewed with patient. Educated patient on medications and gave Rx with instructions of when and how to take medications. PIV removed without any issues. Patient asked appropriate questions and reports she will make her follow up appointment. Patient will be ready to discharge when her ride arrives.

## 2016-09-30 NOTE — Discharge Summary (Signed)
Physician Discharge Summary  Lauren Glenn BOF:[id number] DOB: Aug 06, 1965 DOA: 09/27/2016  PCP: Patient, No Pcp Per  Admit date: 09/27/2016 Discharge date: 09/30/2016  Admitted From:home Disposition:home  Recommendations for Outpatient Follow-up:  1. Follow up with PCP in 1-2 weeks 2. Please obtain BMP/CBC in one week  Home Health:no Equipment/Devices:no Discharge Condition:stable CODE STATUS:full Diet recommendation:heart healthy  Brief/Interim Summary: 51 year old female presented with fever and chills and nausea vomiting and significant supra pubic pain. Found to have acute pyelonephritis.  #Sepsis due to acute pyelonephritis: Patient with significant clinical improvement. The blood culture sent on admission are not growing so far. The urine culture was sent after receiving antibiotics. Patient is clinically improved. Treated with IV ceftriaxone in the hospital and transitioning to oral Levaquin on discharge to complete 2 weeks course. Outpatient follow-up with PCP was already made. I recommended patient to follow-up the pending lab results with PCP.  #Hypotension: Blood pressure improved. Continue to monitor.  #Mild thrombocytopenia: Likely in the setting of infection. Platelet count is improving. No sign of bleeding. I recommended patient to monitor labs in a week with PCP.  Discharge Diagnoses:  Principal Problem:   Sepsis secondary to UTI [facility]) Active Problems:   Acute pyelonephritis   Sepsis ([place])   Hypotension    Discharge Instructions  Discharge Instructions    Call MD for:  difficulty breathing, headache or visual disturbances    Complete by:  As directed    Call MD for:  extreme fatigue    Complete by:  As directed    Call MD for:  hives    Complete by:  As directed    Call MD for:  persistant dizziness or light-headedness    Complete by:  As directed    Call MD for:  persistant nausea and vomiting    Complete by:  As directed    Call MD for:  severe  uncontrolled pain    Complete by:  As directed    Call MD for:  temperature >100.4    Complete by:  As directed    Diet - low sodium heart healthy    Complete by:  As directed    Discharge instructions    Complete by:  As directed    Please take probiotics or yogurt while on antibiotics.   Increase activity slowly    Complete by:  As directed      Allergies as of 09/30/2016   No Known Allergies     Medication List    TAKE these medications   levofloxacin 500 MG tablet Commonly known as:  LEVAQUIN Take 1 tablet (500 mg total) by mouth daily.   ondansetron 4 MG tablet Commonly known as:  ZOFRAN Take 1 tablet (4 mg total) by mouth every 6 (six) hours as needed for nausea.      Follow-up Information    Koirala, Dibas, MD Follow up on 10/08/2016.   Specialty:  Family Medicine Why:  11:45 for hospital follow up and new patient  appointment for primary physician Contact information: [place] [address] [phone]          No Known Allergies  Consultations: None  Procedures/Studies: None  Subjective: Patient was seen and examined at bedside. She reported feeling much better. Denied headache, dizziness, nausea, vomiting, chest pain, shortness of breath or abdominal pain. No urinary complaints today.  Discharge Exam: Vitals:   09/30/16 0600 09/30/16 0700  BP: 106/78   Pulse: 64   Resp: (!) 25  Temp:  98.2 F (36.8 C)   Vitals:   09/30/16 0400 09/30/16 0500 09/30/16 0600 09/30/16 0700  BP: 121/73 126/80 106/78   Pulse: 80 71 64   Resp: (!) 24 (!) 21 (!) 25   Temp: 98.4 F (36.9 C)   98.2 F (36.8 C)  TempSrc: Oral   Oral  SpO2: 97% 96% 98%   Weight:      Height:        General: Pt is alert, awake, not in acute distress Cardiovascular: RRR, S1/S2 +, no rubs, no gallops Respiratory: CTA bilaterally, no wheezing, no rhonchi Abdominal: Soft, NT, ND, bowel sounds + Extremities: no edema, no cyanosis    The  results of significant diagnostics from this hospitalization (including imaging, microbiology, ancillary and laboratory) are listed below for reference.     Microbiology: Recent Results (from the past 240 hour(s))  Culture, blood (routine x 2)     Status: None (Preliminary result)   Collection Time: 09/27/16  7:58 PM  Result Value Ref Range Status   Specimen Description BLOOD RIGHT ARM  Final   Special Requests   Final    BOTTLES DRAWN AEROBIC AND ANAEROBIC Blood Culture adequate volume   Culture NO GROWTH 2 DAYS  Final   Report Status PENDING  Incomplete  Culture, blood (routine x 2)     Status: None (Preliminary result)   Collection Time: 09/27/16  8:00 PM  Result Value Ref Range Status   Specimen Description BLOOD RIGHT FOREARM  Final   Special Requests IN PEDIATRIC BOTTLE Blood Culture adequate volume  Final   Culture NO GROWTH 2 DAYS  Final   Report Status PENDING  Incomplete  MRSA PCR Screening     Status: None   Collection Time: 09/28/16 12:37 AM  Result Value Ref Range Status   MRSA by PCR NEGATIVE NEGATIVE Final    Comment:        The GeneXpert MRSA Assay (FDA approved for NASAL specimens only), is one component of a comprehensive MRSA colonization surveillance program. It is not intended to diagnose MRSA infection nor to guide or monitor treatment for MRSA infections.      Labs: BNP (last 3 results) No results for input(s): BNP in the last 8760 hours. Basic Metabolic Panel:  Recent Labs Lab 09/27/16 1600 09/28/16 0231 09/29/16 0209  NA 135 135 139  K 4.3 4.9 4.3  CL 101 103 110  CO2 26 23 23   GLUCOSE 131* 116* 100*  BUN 8 9 6   CREATININE 1.25* 1.34* 0.94  CALCIUM 8.8* 7.8* 7.5*  MG  --  1.8 2.4  PHOS  --  1.9*  --    Liver Function Tests:  Recent Labs Lab 09/27/16 1600 09/28/16 0231  AST 21 22  ALT 16 13*  ALKPHOS 56 54  BILITOT 0.9 1.3*  PROT 6.7 5.9*  ALBUMIN 3.2* 3.0*   No results for input(s): LIPASE, AMYLASE in the last 168  hours. No results for input(s): AMMONIA in the last 168 hours. CBC:  Recent Labs Lab 09/27/16 1600 09/28/16 0231 09/29/16 0209  WBC 12.3* 10.8* 7.6  NEUTROABS 9.6*  --   --   HGB 13.4 13.0 11.7*  HCT 39.6 38.0 35.0*  MCV 82.5 83.0 83.1  PLT 139* 120* 132*   Cardiac Enzymes: No results for input(s): CKTOTAL, CKMB, CKMBINDEX, TROPONINI in the last 168 hours. BNP: Invalid input(s): POCBNP CBG:  Recent Labs Lab 09/28/16 0837 09/28/16 1156 09/28/16 1629  GLUCAP 101* 99 107*  D-Dimer No results for input(s): DDIMER in the last 72 hours. Hgb A1c No results for input(s): HGBA1C in the last 72 hours. Lipid Profile No results for input(s): CHOL, HDL, LDLCALC, TRIG, CHOLHDL, LDLDIRECT in the last 72 hours. Thyroid function studies  Recent Labs  09/28/16 0231  TSH 1.364   Anemia work up No results for input(s): VITAMINB12, FOLATE, FERRITIN, TIBC, IRON, RETICCTPCT in the last 72 hours. Urinalysis    Component Value Date/Time   COLORURINE YELLOW 09/27/2016 1954   APPEARANCEUR CLOUDY (A) 09/27/2016 1954   LABSPEC 1.006 09/27/2016 1954   PHURINE 5.0 09/27/2016 [place] NEGATIVE 09/27/2016 1954   HGBUR MODERATE (A) 09/27/2016 [place] NEGATIVE 09/27/2016 [place] NEGATIVE 09/27/2016 1954   PROTEINUR 30 (A) 09/27/2016 1954   NITRITE NEGATIVE 09/27/2016 1954   LEUKOCYTESUR LARGE (A) 09/27/2016 1954   Sepsis Labs Invalid input(s): PROCALCITONIN,  WBC,  LACTICIDVEN Microbiology Recent Results (from the past 240 hour(s))  Culture, blood (routine x 2)     Status: None (Preliminary result)   Collection Time: 09/27/16  7:58 PM  Result Value Ref Range Status   Specimen Description BLOOD RIGHT ARM  Final   Special Requests   Final    BOTTLES DRAWN AEROBIC AND ANAEROBIC Blood Culture adequate volume   Culture NO GROWTH 2 DAYS  Final   Report Status PENDING  Incomplete  Culture, blood (routine x 2)     Status: None (Preliminary result)   Collection  Time: 09/27/16  8:00 PM  Result Value Ref Range Status   Specimen Description BLOOD RIGHT FOREARM  Final   Special Requests IN PEDIATRIC BOTTLE Blood Culture adequate volume  Final   Culture NO GROWTH 2 DAYS  Final   Report Status PENDING  Incomplete  MRSA PCR Screening     Status: None   Collection Time: 09/28/16 12:37 AM  Result Value Ref Range Status   MRSA by PCR NEGATIVE NEGATIVE Final    Comment:        The GeneXpert MRSA Assay (FDA approved for NASAL specimens only), is one component of a comprehensive MRSA colonization surveillance program. It is not intended to diagnose MRSA infection nor to guide or monitor treatment for MRSA infections.      Time coordinating discharge: 27 minutes  SIGNED:   Rosita Fire, MD  [facility] 09/30/2016, 11:35 AM  If 7PM-7AM, please contact night-coverage [email or web address] Password [email or web address]

## 2016-10-01 LAB — URINE CULTURE: CULTURE: NO GROWTH

## 2016-10-02 LAB — CULTURE, BLOOD (ROUTINE X 2)
CULTURE: NO GROWTH
Culture: NO GROWTH
SPECIAL REQUESTS: ADEQUATE
SPECIAL REQUESTS: ADEQUATE

## 2016-12-16 ENCOUNTER — Other Ambulatory Visit: Payer: Self-pay | Admitting: Family Medicine

## 2016-12-16 DIAGNOSIS — R7989 Other specified abnormal findings of blood chemistry: Secondary | ICD-10-CM

## 2016-12-17 ENCOUNTER — Ambulatory Visit
Admission: RE | Admit: 2016-12-17 | Discharge: 2016-12-17 | Disposition: A | Discharge status: Home / Self Care | Payer: Managed Care, Other (non HMO) | Source: Ambulatory Visit | Attending: Family Medicine | Admitting: Family Medicine

## 2016-12-17 DIAGNOSIS — R7989 Other specified abnormal findings of blood chemistry: Secondary | ICD-10-CM

## 2018-01-19 ENCOUNTER — Ambulatory Visit: Payer: 59 | Admitting: Podiatry

## 2018-01-19 ENCOUNTER — Ambulatory Visit (INDEPENDENT_AMBULATORY_CARE_PROVIDER_SITE_OTHER): Payer: 59

## 2018-01-19 ENCOUNTER — Other Ambulatory Visit: Payer: Self-pay | Admitting: Podiatry

## 2018-01-19 ENCOUNTER — Encounter: Payer: Self-pay | Admitting: Podiatry

## 2018-01-19 DIAGNOSIS — M21619 Bunion of unspecified foot: Secondary | ICD-10-CM

## 2018-01-19 DIAGNOSIS — M7752 Other enthesopathy of left foot: Secondary | ICD-10-CM

## 2018-01-19 DIAGNOSIS — M79672 Pain in left foot: Secondary | ICD-10-CM

## 2018-01-19 DIAGNOSIS — M779 Enthesopathy, unspecified: Secondary | ICD-10-CM

## 2018-01-19 DIAGNOSIS — M2042 Other hammer toe(s) (acquired), left foot: Secondary | ICD-10-CM

## 2018-01-19 MED ORDER — TRIAMCINOLONE ACETONIDE 10 MG/ML IJ SUSP
10.0000 mg | Freq: Once | INTRAMUSCULAR | Status: AC
Start: 2018-01-19 — End: 2018-01-19
  Administered 2018-01-19: 10 mg

## 2018-01-19 NOTE — Patient Instructions (Signed)

## 2018-01-19 NOTE — Progress Notes (Signed)
Subjective:   Patient ID: Lauren Glenn, female   DOB: 52 y.o.   MRN: [id number]   HPI Patient presents stating that she has a painful bunion left that she knows she needs to be corrected she had one done on the right along with hammertoes and hammertoe deformity the second third and fourth toes that are sore.  Also has a mass this is come up on the top of the left foot over the last 4 weeks that sore and making it hard to wear shoe gear.  Patient does not remember injury to this and states the bunion is been present for a long time and that she is tried wider shoes and other modalities.  Patient is from [place] and is moved here in the last 2 years   Review of Systems  All other systems reviewed and are negative.       Objective:  Physical Exam  Constitutional: She appears well-developed and well-nourished.  Cardiovascular: Intact distal pulses.  Pulmonary/Chest: Effort normal.  Musculoskeletal: Normal range of motion.  Neurological: She is alert.  Skin: Skin is warm.  Nursing note and vitals reviewed.   Neurovascular status intact muscle strength adequate range of motion intact with patient found to have large structural bunion deformity left that is painful when pressed and making shoe gear difficult.  Patient has painful keratotic lesions digits 234 with moderate deformity of the toes and is noted to have a mass on the top of the left foot around the first metatarsal cuneiform slightly proximal measuring 2 cm x 1/2 cm that is painful with the possibility of a cyst formation.  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  Today I did a careful injection of the dorsal left 3 mg Kenalog 5 mg Xylocaine to try to shrink the inflamed mass we will see how it responds over the next 2 weeks and I do want to get an x-ray of her right foot at next visit so I can compare the 2 feet   X-rays indicate there is a large structural bunion deformity left with digital deformities and the possibility for spurring around the first metatarsal cuneiform joint with increase in the soft tissue density

## 2018-02-03 ENCOUNTER — Encounter: Payer: Self-pay | Admitting: Podiatry

## 2018-02-03 ENCOUNTER — Ambulatory Visit: Payer: 59 | Admitting: Podiatry

## 2018-02-03 DIAGNOSIS — M2042 Other hammer toe(s) (acquired), left foot: Secondary | ICD-10-CM

## 2018-02-03 DIAGNOSIS — M21619 Bunion of unspecified foot: Secondary | ICD-10-CM | POA: Diagnosis not present

## 2018-02-04 NOTE — Progress Notes (Signed)
Subjective:   Patient ID: Lauren Glenn, female   DOB: 52 y.o.   MRN: [id number]   HPI Patient states the spot on the top has gotten a lot better and I want to get my foot fixed but I need to wait for several months due to my work schedule but I like to just discuss what needs to be done   ROS      Objective:  Physical Exam  Neurovascular status intact with significant diminishment of discomfort in the metatarsal cuneiform joint with no palpation of any kind of lesion there with large structural bunion deformity left over right and hammertoe deformity second third and fourth digits with keratotic tissue formation and pain.  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  I allowed her to go over what we would do after I reviewed it with her and she wants to have it done and will have it done later this year and will call to schedule to see me prior to go over in greater detail.  Top of the left foot appears to have resolved with no problems

## 2018-05-29 ENCOUNTER — Telehealth: Payer: Self-pay | Admitting: *Deleted

## 2018-05-29 NOTE — Telephone Encounter (Signed)
"  I was calling to see if I could schedule my bunionectomy and hammer toe surgery with Dr. Paulla Dolly.  I want to target January 24 if possible.  Give me a call back."

## 2018-05-31 NOTE — Telephone Encounter (Signed)
"  I have called several times to schedule my surgery and I haven't received a call back."  I am sorry.  I can help you with getting scheduled.  Dr. Paulla Dolly does surgeries on Tuesdays.  His next available date is January 28.  However, you will need to schedule an appointment with Dr. Paulla Dolly to sign consent forms.  "That date is perfect, put me down for then or is it possible?"  I'll put you down tentatively until you come in for your appointment then I can get it permanently scheduled.  Would you like me to transfer you to a scheduler so you can make an appointment with Dr. Paulla Dolly for a surgery consult?  "That will be fine."  I transferred her to [place].

## 2018-06-05 ENCOUNTER — Encounter: Payer: Self-pay | Admitting: Podiatry

## 2018-06-05 ENCOUNTER — Ambulatory Visit: Payer: 59 | Admitting: Podiatry

## 2018-06-05 DIAGNOSIS — M2042 Other hammer toe(s) (acquired), left foot: Secondary | ICD-10-CM | POA: Diagnosis not present

## 2018-06-05 DIAGNOSIS — M21619 Bunion of unspecified foot: Secondary | ICD-10-CM

## 2018-06-05 NOTE — Patient Instructions (Signed)
Pre-Operative Instructions  Congratulations, you have decided to take an important step towards improving your quality of life.  You can be assured that the doctors and staff at [facility] will be with you every step of the way.  Here are some important things you should know:  1. Plan to be at the surgery center/hospital at least 1 (one) hour prior to your scheduled time, unless otherwise directed by the surgical center/hospital staff.  You must have a responsible adult accompany you, remain during the surgery and drive you home.  Make sure you have directions to the surgical center/hospital to ensure you arrive on time. 2. If you are having surgery at [facility] or [facility] hospitals, you will need a copy of your medical history and physical form from your family physician within one month prior to the date of surgery. We will give you a form for your primary physician to complete.  3. We make every effort to accommodate the date you request for surgery.  However, there are times where surgery dates or times have to be moved.  We will contact you as soon as possible if a change in schedule is required.   4. No aspirin/ibuprofen for one week before surgery.  If you are on aspirin, any non-steroidal anti-inflammatory medications (Mobic, Aleve, Ibuprofen) should not be taken seven (7) days prior to your surgery.  You make take Tylenol for pain prior to surgery.  5. Medications - If you are taking daily heart and blood pressure medications, seizure, reflux, allergy, asthma, anxiety, pain or diabetes medications, make sure you notify the surgery center/hospital before the day of surgery so they can tell you which medications you should take or avoid the day of surgery. 6. No food or drink after midnight the night before surgery unless directed otherwise by surgical center/hospital staff. 7. No alcoholic beverages 24-hours prior to surgery.  No smoking 24-hours prior or 24-hours after  surgery. 8. Wear loose pants or shorts. They should be loose enough to fit over bandages, boots, and casts. 9. Don't wear slip-on shoes. Sneakers are preferred. 10. Bring your boot with you to the surgery center/hospital.  Also bring crutches or a walker if your physician has prescribed it for you.  If you do not have this equipment, it will be provided for you after surgery. 11. If you have not been contacted by the surgery center/hospital by the day before your surgery, call to confirm the date and time of your surgery. 12. Leave-time from work may vary depending on the type of surgery you have.  Appropriate arrangements should be made prior to surgery with your employer. 13. Prescriptions will be provided immediately following surgery by your doctor.  Fill these as soon as possible after surgery and take the medication as directed. Pain medications will not be refilled on weekends and must be approved by the doctor. 14. Remove nail polish on the operative foot and avoid getting pedicures prior to surgery. 15. Wash the night before surgery.  The night before surgery wash the foot and leg well with water and the antibacterial soap provided. Be sure to pay special attention to beneath the toenails and in between the toes.  Wash for at least three (3) minutes. Rinse thoroughly with water and dry well with a towel.  Perform this wash unless told not to do so by your physician.  Enclosed: 1 Ice pack (please put in freezer the night before surgery)   1 Hibiclens skin cleaner     Pre-op instructions  If you have any questions regarding the instructions, please do not hesitate to call our office.  [place]: [address], [place], [place] [address] -- [phone]  [place]: [address] [place], [place] [address] -- [phone]  [place]: [address]  [place] [address] -- [phone]  [place]: [address], [address], [place], [place] [address] -- [phone]  Website:  [email or web address] 

## 2018-06-12 NOTE — Progress Notes (Signed)
Subjective:   Patient ID: Lauren Glenn, female   DOB: 53 y.o.   MRN: [id number]   HPI Patient presents stating she is very excited to get her foot fixed as it is been hurting her for a long time and making shoe gear difficult   ROS      Objective:  Physical Exam  Neurovascular status intact with patient found to have significant structural bunion deformity left and elevation of the lesser digits left over right foot with keratotic lesions on top of the toes bilateral     Assessment:  HAV deformity left foot chronic in nature with hammertoe deformity digits 2 through 4 that are painful when pressed and make shoe gear difficult     Plan:  H&P x-rays reviewed conditions discussed at great length.  I do think distal osteotomy left along with digital repair with possible pins would be the best procedures and I allowed her to read consent form going over alternative treatments and complications.  Patient wants surgery understanding risk and at this point signed consent form after extensive review of the consent form as written.  Patient is comfortable with this and I also went ahead today and I did dispense air fracture walker with all instructions for usage postoperatively and I did discuss finding shoes on her other side that fit appropriately.  Patient does understand total recovery will take 6 months to 1 year and is encouraged to call with questions

## 2018-06-20 ENCOUNTER — Encounter: Payer: Self-pay | Admitting: Podiatry

## 2018-06-20 DIAGNOSIS — M2012 Hallux valgus (acquired), left foot: Secondary | ICD-10-CM

## 2018-06-20 DIAGNOSIS — M2042 Other hammer toe(s) (acquired), left foot: Secondary | ICD-10-CM | POA: Diagnosis not present

## 2018-06-26 ENCOUNTER — Ambulatory Visit (INDEPENDENT_AMBULATORY_CARE_PROVIDER_SITE_OTHER): Payer: 59

## 2018-06-26 ENCOUNTER — Ambulatory Visit (INDEPENDENT_AMBULATORY_CARE_PROVIDER_SITE_OTHER): Payer: Self-pay | Admitting: Sports Medicine

## 2018-06-26 ENCOUNTER — Encounter: Payer: Self-pay | Admitting: Sports Medicine

## 2018-06-26 VITALS — Temp 99.1°F

## 2018-06-26 DIAGNOSIS — M21619 Bunion of unspecified foot: Secondary | ICD-10-CM

## 2018-06-26 DIAGNOSIS — Z09 Encounter for follow-up examination after completed treatment for conditions other than malignant neoplasm: Secondary | ICD-10-CM

## 2018-06-26 DIAGNOSIS — M2042 Other hammer toe(s) (acquired), left foot: Secondary | ICD-10-CM | POA: Diagnosis not present

## 2018-06-26 DIAGNOSIS — M2021 Hallux rigidus, right foot: Secondary | ICD-10-CM

## 2018-06-26 DIAGNOSIS — M79672 Pain in left foot: Secondary | ICD-10-CM

## 2018-06-26 DIAGNOSIS — D509 Iron deficiency anemia, unspecified: Secondary | ICD-10-CM | POA: Insufficient documentation

## 2018-06-26 NOTE — Progress Notes (Signed)
Subjective: Lauren Glenn is a 53 y.o. female patient seen today in office for POV #1, dos 01.28.2020 [place] Bunionectomy [place], [place] [facility] 2,3,4 Lt with Dr. Paulla Dolly. Patient admits pain at surgical site that is slowly getting better, denies calf pain, denies headache, chest pain, shortness of breath, nausea, vomiting, fever, or chills. Patient states that she has been using crutches to help keep pressure off her foot because she has increase pain when she tries to put pressure. No other issues noted.   Patient Active Problem List   Diagnosis Date Noted  . Iron deficiency anemia 06/26/2018  . Hypotension 09/28/2016  . Acute pyelonephritis 09/27/2016  . Sepsis secondary to UTI ([place]) 09/27/2016  . Sepsis ([place]) 09/27/2016  . History of hysterectomy for benign disease 10/03/2014  . Fibroid uterus 09/16/2014    No current outpatient medications on file prior to visit.   No current facility-administered medications on file prior to visit.     No Known Allergies  Objective: There were no vitals filed for this visit.  General: No acute distress, AAOx3  Left foot: Sutures intact with no gapping or dehiscence at surgical sites, mild swelling to left foot, no erythema, no warmth, no drainage, no signs of infection noted, Capillary fill time <3 seconds in all digits, gross sensation present via light touch to left foot. No pain or crepitation with range of motion left foot.  No pain with calf compression.   Post Op Xray, Left foot: 1st metatarsal in excellent alignment and position. Osteotomy site healing. Hardware intact. Arthroplasty of lesser toes, Soft tissue swelling within normal limits for post op status.   Assessment and Plan:  Problem List Items Addressed This Visit    None    Visit Diagnoses    Bunion    -  Primary   Relevant Orders   DG Foot 2 Views Left (Completed)   Hammer toe of left foot       Relevant Orders   DG Foot 2 Views Left (Completed)   Surgery follow-up       Relevant Orders   DG Foot 2 Views Left (Completed)   Left foot pain           -Patient seen and evaluated -Xrays reviewed  -Applied dry sterile dressing to surgical site left foot secured with ACE wrap and stockinet  -Advised patient to make sure to keep dressings clean, dry, and intact to left surgical site, removing the ACE as needed  -Advised patient to continue with post-op shoe as dispensed today and crutches as partial weightbearing to tolerance -Continue with pain meds PRN  -Advised patient to limit activity to necessity  -Advised patient to ice and elevate as necessary  -Will plan for suture removal with Dr. Paulla Dolly at next office visit. In the meantime, patient to call office if any issues or problems arise.   Landis Martins, DPM

## 2018-07-10 ENCOUNTER — Ambulatory Visit (INDEPENDENT_AMBULATORY_CARE_PROVIDER_SITE_OTHER): Payer: 59

## 2018-07-10 ENCOUNTER — Ambulatory Visit (INDEPENDENT_AMBULATORY_CARE_PROVIDER_SITE_OTHER): Payer: 59 | Admitting: Podiatry

## 2018-07-10 ENCOUNTER — Encounter: Payer: Self-pay | Admitting: Podiatry

## 2018-07-10 DIAGNOSIS — M21619 Bunion of unspecified foot: Secondary | ICD-10-CM

## 2018-07-10 DIAGNOSIS — M2042 Other hammer toe(s) (acquired), left foot: Secondary | ICD-10-CM

## 2018-07-10 DIAGNOSIS — Z09 Encounter for follow-up examination after completed treatment for conditions other than malignant neoplasm: Secondary | ICD-10-CM

## 2018-07-12 NOTE — Progress Notes (Signed)
Subjective:   Patient ID: Lauren Glenn, female   DOB: 53 y.o.   MRN: [id number]   HPI Patient presents stating I am doing very well with my surgery with minimal discomfort and I am able to walk without any throbbing or aching pain   ROS      Objective:  Physical Exam  Neurovascular status intact with patient's left foot healing well with wound edges well coapted negative Homans sign noted good alignment of the first metatarsal and digits in good alignment with stitches in place     Assessment:  Doing well post forefoot reconstruction left     Plan:  H&P x-rays reviewed stitches removed with several left and that were too embedded.  I advised on range of motion exercises for the first MPJ continuation of the immobilization and elevation compression and reappoint in 3 weeks or earlier if needed  X-rays indicate the osteotomy is healing well fixation in place with good alignment and joint congruence

## 2018-07-31 ENCOUNTER — Encounter: Payer: Self-pay | Admitting: Podiatry

## 2018-07-31 ENCOUNTER — Ambulatory Visit (INDEPENDENT_AMBULATORY_CARE_PROVIDER_SITE_OTHER): Payer: 59

## 2018-07-31 ENCOUNTER — Ambulatory Visit (INDEPENDENT_AMBULATORY_CARE_PROVIDER_SITE_OTHER): Payer: 59 | Admitting: Podiatry

## 2018-07-31 DIAGNOSIS — M2042 Other hammer toe(s) (acquired), left foot: Secondary | ICD-10-CM

## 2018-07-31 DIAGNOSIS — M21619 Bunion of unspecified foot: Secondary | ICD-10-CM

## 2018-08-02 NOTE — Progress Notes (Signed)
Subjective:   Patient ID: Lauren Glenn, female   DOB: 53 y.o.   MRN: [id number]   HPI Patient presents with structural deformity that is done very well postoperatively left and states that she is walking normally at this time and has minimal discomfort   ROS      Objective:  Physical Exam  Neurovascular status intact negative Homans sign was noted with patient wound edges well coapted hallux in rectus position and digits in good alignment     Assessment:  Doing well post osteotomy left first metatarsal hammertoe correction     Plan:  H&P conditions reviewed and recommended gradual return to normal activities with continued boot usage as needed and gradual return to all normal shoe gear.  Patient will be seen back in the next 8 weeks or earlier if needed  X-ray indicates osteotomies healing well fixation in place joint congruence digits in good alignment

## 2018-08-30 ENCOUNTER — Encounter: Payer: Self-pay | Admitting: Podiatry

## 2018-08-30 ENCOUNTER — Other Ambulatory Visit: Payer: Self-pay

## 2018-08-30 ENCOUNTER — Telehealth: Payer: Self-pay | Admitting: Podiatry

## 2018-08-30 ENCOUNTER — Ambulatory Visit (INDEPENDENT_AMBULATORY_CARE_PROVIDER_SITE_OTHER): Payer: 59 | Admitting: Podiatry

## 2018-08-30 VITALS — Temp 97.5°F

## 2018-08-30 DIAGNOSIS — M2042 Other hammer toe(s) (acquired), left foot: Secondary | ICD-10-CM

## 2018-08-30 NOTE — Telephone Encounter (Signed)
I called pt, she states the stitch is in the 3rd left toe and is causing pain and she can't see good enough to remove. I offered pt an appt today at 4:00pm today. Pt accepted.

## 2018-08-30 NOTE — Telephone Encounter (Signed)
Patient had surgery on 1.28.20 and still has a stitch in her foot that is starting to cause some pain and has left an impression in her skin. Please give patient a call.

## 2018-08-31 NOTE — Progress Notes (Signed)
Subjective:   Patient ID: Lauren Glenn, female   DOB: 53 y.o.   MRN: [id number]   HPI Patient presents with several stitches in her toe that she needs   ROS      Objective:  Physical Exam  Healing well with several stitches that are present     Assessment:  Doing well digital hammertoe procedure with several stitches     Plan:  Stitches removed with no pathology and will reappoint for normal visit or earlier if any issues were to occur

## 2018-09-11 ENCOUNTER — Ambulatory Visit: Payer: 59 | Admitting: Podiatry

## 2018-10-23 ENCOUNTER — Other Ambulatory Visit: Payer: Self-pay

## 2018-10-23 ENCOUNTER — Encounter: Payer: Self-pay | Admitting: Podiatry

## 2018-10-23 ENCOUNTER — Ambulatory Visit (INDEPENDENT_AMBULATORY_CARE_PROVIDER_SITE_OTHER): Payer: 59

## 2018-10-23 ENCOUNTER — Ambulatory Visit: Payer: 59 | Admitting: Podiatry

## 2018-10-23 VITALS — Temp 97.3°F

## 2018-10-23 DIAGNOSIS — M2042 Other hammer toe(s) (acquired), left foot: Secondary | ICD-10-CM

## 2018-10-23 DIAGNOSIS — M21619 Bunion of unspecified foot: Secondary | ICD-10-CM

## 2018-10-25 NOTE — Progress Notes (Signed)
Subjective:   Patient ID: Lauren Glenn, female   DOB: 53 y.o.   MRN: [id number]   HPI Patient states doing pretty well but I still get some discomfort in my left second and third toes but overall doing well   ROS      Objective:  Physical Exam  Neurovascular status intact negative Homans sign noted wound edges well coapted digits in good alignment first metatarsal good alignment with wound edges well coapted     Assessment:  Doing fine post forefoot reconstruction     Plan:  H&P conditions reviewed and recommended continued range of motion wider shoes and that gradually the swelling will continue to reduce.  Patient will be seen back to recheck 2 months or earlier if needed  X-rays indicate structurally doing well wound edges well coapted joint congruence fixation in place

## 2018-12-02 ENCOUNTER — Emergency Department (HOSPITAL_COMMUNITY): Payer: 59

## 2018-12-02 ENCOUNTER — Other Ambulatory Visit: Payer: Self-pay

## 2018-12-02 ENCOUNTER — Encounter (HOSPITAL_COMMUNITY): Payer: Self-pay

## 2018-12-02 ENCOUNTER — Emergency Department (HOSPITAL_COMMUNITY)
Admission: EM | Admit: 2018-12-02 | Discharge: 2018-12-02 | Disposition: A | Discharge status: Home / Self Care | Payer: 59 | Attending: Emergency Medicine | Admitting: Emergency Medicine

## 2018-12-02 DIAGNOSIS — Y999 Unspecified external cause status: Secondary | ICD-10-CM | POA: Insufficient documentation

## 2018-12-02 DIAGNOSIS — S4991XA Unspecified injury of right shoulder and upper arm, initial encounter: Secondary | ICD-10-CM | POA: Diagnosis not present

## 2018-12-02 DIAGNOSIS — W010XXA Fall on same level from slipping, tripping and stumbling without subsequent striking against object, initial encounter: Secondary | ICD-10-CM | POA: Diagnosis not present

## 2018-12-02 DIAGNOSIS — M25511 Pain in right shoulder: Secondary | ICD-10-CM | POA: Diagnosis not present

## 2018-12-02 DIAGNOSIS — Y9389 Activity, other specified: Secondary | ICD-10-CM | POA: Diagnosis not present

## 2018-12-02 DIAGNOSIS — M25531 Pain in right wrist: Secondary | ICD-10-CM | POA: Insufficient documentation

## 2018-12-02 DIAGNOSIS — Y92003 Bedroom of unspecified non-institutional (private) residence as the place of occurrence of the external cause: Secondary | ICD-10-CM | POA: Diagnosis not present

## 2018-12-02 MED ORDER — NAPROXEN 250 MG PO TABS
500.0000 mg | ORAL_TABLET | Freq: Once | ORAL | Status: AC
  Administered 2018-12-02: 500 mg via ORAL
  Filled 2018-12-02: qty 2

## 2018-12-02 MED ORDER — METHOCARBAMOL 500 MG PO TABS: 500.0000 mg | ORAL_TABLET | Freq: Three times a day (TID) | ORAL | 0 refills | Status: DC | PRN

## 2018-12-02 MED ORDER — NAPROXEN 500 MG PO TABS: 500.0000 mg | ORAL_TABLET | Freq: Two times a day (BID) | ORAL | 0 refills | Status: DC

## 2018-12-02 NOTE — ED Triage Notes (Signed)
Pt arrives POV for eval of R sided shoulder pain, states she was trying to catch herself from falling and heard a "Pop" in shoulder. Pt denies hx of same, states she feels as though it's dislocated. +CSM to R hand.

## 2018-12-02 NOTE — Discharge Instructions (Addendum)
Please read and follow all provided instructions.  You have been seen today for a right upper extremity injury.   Tests performed today include: An x-ray of the affected area - does NOT show any broken bones or dislocations.  Vital signs. See below for your results today.   Medications:  - Naproxen is a nonsteroidal anti-inflammatory medication that will help with pain and swelling. Be sure to take this medication as prescribed with food, 1 pill every 12 hours,  It should be taken with food, as it can cause stomach upset, and more seriously, stomach bleeding. Do not take other nonsteroidal anti-inflammatory medications with this such as Advil, Motrin, Aleve, Mobic, Goodie Powder, or Motrin.    - Robaxin is the muscle relaxer I have prescribed, this is meant to help with muscle tightness. Be aware that this medication may make you drowsy therefore the first time you take this it should be at a time you are in an environment where you can rest. Do not drive or operate heavy machinery when taking this medication. Do not drink alcohol or take other sedating medications with this medicine such as narcotics or benzodiazepines.   You make take Tylenol per over the counter dosing with these medications.   We have prescribed you new medication(s) today. Discuss the medications prescribed today with your pharmacist as they can have adverse effects and interactions with your other medicines including over the counter and prescribed medications. Seek medical evaluation if you start to experience new or abnormal symptoms after taking one of these medicines, seek care immediately if you start to experience difficulty breathing, feeling of your throat closing, facial swelling, or rash as these could be indications of a more serious allergic reaction   Follow-up instructions: Please follow-up with your primary care provider or the provided orthopedic physician (bone specialist) if you continue to have significant  pain in 1 week. In this case you may have a more severe injury that requires further care.   Return instructions:  Please return if your digits or extremity are numb or tingling, appear gray or blue, or you have severe pain (also elevate the extremity and loosen splint or wrap if you were given one) Please return if you have redness or fevers.  Please return to the Emergency Department if you experience worsening symptoms.  Please return if you have any other emergent concerns. Additional Information:  Your vital signs today were: BP (!) 146/77 (BP Location: Right Arm)    Pulse (!) 55    Temp 97.8 F (36.6 C) (Oral)    Resp 18    Ht 5' 4.5" (1.638 m)    Wt 70.8 kg    SpO2 98%    BMI 26.36 kg/m  If your blood pressure (BP) was elevated above 135/85 this visit, please have this repeated by your doctor within one month. ---------------

## 2018-12-02 NOTE — ED Notes (Signed)
Patient transported to X-ray 

## 2018-12-02 NOTE — ED Provider Notes (Signed)
[facility] EMERGENCY DEPARTMENT Provider Note   CSN: [id number] Arrival date & time: 12/02/18  1129     History   Chief Complaint Chief Complaint  Patient presents with  . Shoulder Injury    HPI Lauren Glenn is a 53 y.o. female with a hx of anemia who presents to the ED with complaints of R shoulder pain s/p injury shortly PTA. Patient states she was getting out of bed, lost her balance, and fell back onto the RUE palmar aspect of the forearm/hand. She states she felt a pop & quick onset pain to the R shoulder. Pain is to the posterior aspect of the shoulder, it is worse w/ movement, no alleviating factors. No intervention PTA. She has some pain to the R wrist which has been there for awhile now, she thinks it might be carpal tunnel, no acute change related to today's injury. Denies numbness, tingling, or weakness. Denies prodromal lightheadedness, dizziness, chest pain, or dyspnea. No head injury or LOC.      HPI  History reviewed. No pertinent past medical history.  Patient Active Problem List   Diagnosis Date Noted  . Iron deficiency anemia 06/26/2018  . Hypotension 09/28/2016  . Acute pyelonephritis 09/27/2016  . Sepsis secondary to UTI ([place]) 09/27/2016  . Sepsis ([place]) 09/27/2016  . History of hysterectomy for benign disease 10/03/2014  . Fibroid uterus 09/16/2014    Past Surgical History:  Procedure Laterality Date  . ABDOMINAL HYSTERECTOMY    . APPENDECTOMY       OB History   No obstetric history on file.      Home Medications    Prior to Admission medications   Not on File    Family History Family History  Problem Relation Age of Onset  . Cancer Other   . CAD Neg Hx   . Stroke Neg Hx   . Diabetes Neg Hx     Social History Social History   Tobacco Use  . Smoking status: Never Smoker  . Smokeless tobacco: Never Used  Substance Use Topics  . Alcohol use: No  . Drug use: No     Allergies   Patient has no known allergies.    Review of Systems Review of Systems  Constitutional: Negative for chills and fever.  Respiratory: Negative for shortness of breath.   Cardiovascular: Negative for chest pain.  Gastrointestinal: Negative for abdominal pain.  Musculoskeletal: Positive for arthralgias. Negative for back pain and neck pain.  Neurological: Negative for dizziness, weakness, light-headedness, numbness and headaches.     Physical Exam Updated Vital Signs BP (!) 146/77 (BP Location: Right Arm)   Pulse (!) 55   Temp 97.8 F (36.6 C) (Oral)   Resp 18   Ht 5' 4.5" (1.638 m)   Wt 70.8 kg   SpO2 98%   BMI 26.36 kg/m   Physical Exam Vitals signs and nursing note reviewed.  Constitutional:      General: She is not in acute distress.    Appearance: Normal appearance. She is not ill-appearing or toxic-appearing.  HENT:     Head: Normocephalic and atraumatic.  Neck:     Musculoskeletal: Normal range of motion and neck supple.     Comments: No midline tenderness.  Cardiovascular:     Rate and Rhythm: Normal rate and regular rhythm.     Pulses:          Radial pulses are 2+ on the right side and 2+ on the left side.  Pulmonary:  Effort: Pulmonary effort is normal. No respiratory distress.     Breath sounds: Normal breath sounds.  Abdominal:     General: There is no distension.     Palpations: Abdomen is soft.     Tenderness: There is no abdominal tenderness.  Musculoskeletal:     Comments: Upper extremities: No obvious deformity, appreciable swelling, edema, erythema, ecchymosis, warmth, or open wounds. Patient has intact AROM throughout with exception of mild limitation to R shoulder flexion equivocal to pain. Tender to palpation over the R trapezius & posterior glenohumeral joint. Also tender over the R dorsal wrist. Otherwise nontender to palpation throughout.   Skin:    General: Skin is warm and dry.     Capillary Refill: Capillary refill takes less than 2 seconds.  Neurological:     Mental  Status: She is alert.     Comments: Alert. Clear speech. Sensation grossly intact to bilateral upper extremities. 5/5 symmetric grip strength & strength with wrist flexion/extension. Ambulatory. Able to perform OK sign, thumbs up, and cross 2nd/3rd digits bilaterally.   Psychiatric:        Mood and Affect: Mood normal.        Behavior: Behavior normal.     ED Treatments / Results  Labs (all labs ordered are listed, but only abnormal results are displayed) Labs Reviewed - No data to display  EKG None  Radiology Dg Shoulder Right  Result Date: 12/02/2018 CLINICAL DATA:  Right shoulder pain. EXAM: RIGHT SHOULDER - 2+ VIEW COMPARISON:  None. FINDINGS: There is no evidence of fracture or dislocation. There is no evidence of arthropathy or other focal bone abnormality. Soft tissues are unremarkable. IMPRESSION: Negative. Electronically Signed   By: Fidela Salisbury M.D.   On: 12/02/2018 12:36   Dg Wrist Complete Right  Result Date: 12/02/2018 CLINICAL DATA:  Right wrist pain. EXAM: RIGHT WRIST - COMPLETE 3+ VIEW COMPARISON:  None. FINDINGS: There is no evidence of fracture or dislocation. There is no evidence of arthropathy or other focal bone abnormality. Soft tissues are unremarkable. IMPRESSION: Negative. Electronically Signed   By: Fidela Salisbury M.D.   On: 12/02/2018 12:36    Procedures Procedures (including critical care time)  Medications Ordered in ED Medications  naproxen (NAPROSYN) tablet 500 mg (500 mg Oral Given 12/02/18 1324)     Initial Impression / Assessment and Plan / ED Course  I have reviewed the triage vital signs and the nursing notes.  Pertinent labs & imaging results that were available during my care of the patient were reviewed by me and considered in my medical decision making (see chart for details).   Patient presents to the emergency department status post right upper extremity injury, states that she fell back onto the arm felt a pop & quick  onset pain in the R shoulder, she has also had some wrist pain for awhile now- no acute change w/ today's injury. No prodromal sxs. Nontoxic appearing, no apparent distress. No obvious deformities. No erythema/warmth/fever- exam not consistent w/ infectious process such as septic joint in either locations of pain. X-rays negative for fx/dislocation. ROM of R shoulder mildly limited, but remains NVI distally. Will discharge home with naproxen & robaxin, discussed no driving/operating heavy machinery with this medicine.Orthopedics follow up.  I discussed results, treatment plan, need for follow-up, and return precautions with the patient. Provided opportunity for questions, patient confirmed understanding and is in agreement with plan.    Final Clinical Impressions(s) / ED Diagnoses   Final diagnoses:  Injury of right shoulder, initial encounter    ED Discharge Orders         Ordered    naproxen (NAPROSYN) 500 MG tablet  2 times daily     12/02/18 1357    methocarbamol (ROBAXIN) 500 MG tablet  Every 8 hours PRN     12/02/18 1357           Shaterrica Territo, Glynda Jaeger, PA-C 12/02/18 1357    Charlesetta Shanks, MD 12/03/18 [phone]

## 2018-12-02 NOTE — ED Notes (Signed)
Pt discharged with all belongings. Discharge instructions reviewed with pt, and pt verbalized understanding. Opportunity for questions provided.  

## 2019-05-04 DIAGNOSIS — Z23 Encounter for immunization: Secondary | ICD-10-CM | POA: Diagnosis not present

## 2019-05-31 DIAGNOSIS — Z01419 Encounter for gynecological examination (general) (routine) without abnormal findings: Secondary | ICD-10-CM | POA: Diagnosis not present

## 2019-05-31 DIAGNOSIS — N951 Menopausal and female climacteric states: Secondary | ICD-10-CM | POA: Diagnosis not present

## 2019-05-31 DIAGNOSIS — Z1329 Encounter for screening for other suspected endocrine disorder: Secondary | ICD-10-CM | POA: Diagnosis not present

## 2019-05-31 DIAGNOSIS — N898 Other specified noninflammatory disorders of vagina: Secondary | ICD-10-CM | POA: Diagnosis not present

## 2019-05-31 DIAGNOSIS — K5900 Constipation, unspecified: Secondary | ICD-10-CM | POA: Diagnosis not present

## 2019-05-31 DIAGNOSIS — Z1231 Encounter for screening mammogram for malignant neoplasm of breast: Secondary | ICD-10-CM | POA: Diagnosis not present

## 2019-05-31 DIAGNOSIS — Z1321 Encounter for screening for nutritional disorder: Secondary | ICD-10-CM | POA: Diagnosis not present

## 2019-05-31 DIAGNOSIS — Z6826 Body mass index (BMI) 26.0-26.9, adult: Secondary | ICD-10-CM | POA: Diagnosis not present

## 2019-07-03 DIAGNOSIS — Z1211 Encounter for screening for malignant neoplasm of colon: Secondary | ICD-10-CM | POA: Diagnosis not present

## 2019-07-03 DIAGNOSIS — K5900 Constipation, unspecified: Secondary | ICD-10-CM | POA: Diagnosis not present

## 2019-07-17 DIAGNOSIS — Z01812 Encounter for preprocedural laboratory examination: Secondary | ICD-10-CM | POA: Diagnosis not present

## 2019-07-20 DIAGNOSIS — K5730 Diverticulosis of large intestine without perforation or abscess without bleeding: Secondary | ICD-10-CM | POA: Diagnosis not present

## 2019-07-20 DIAGNOSIS — Z1211 Encounter for screening for malignant neoplasm of colon: Secondary | ICD-10-CM | POA: Diagnosis not present

## 2021-02-02 IMAGING — DX RIGHT WRIST - COMPLETE 3+ VIEW
4 series · 4 of 4 positions shown · non-contrast
Comparison: None.

CLINICAL DATA: Right wrist pain.

EXAM:
RIGHT WRIST - COMPLETE 3+ VIEW

[wrist pa]
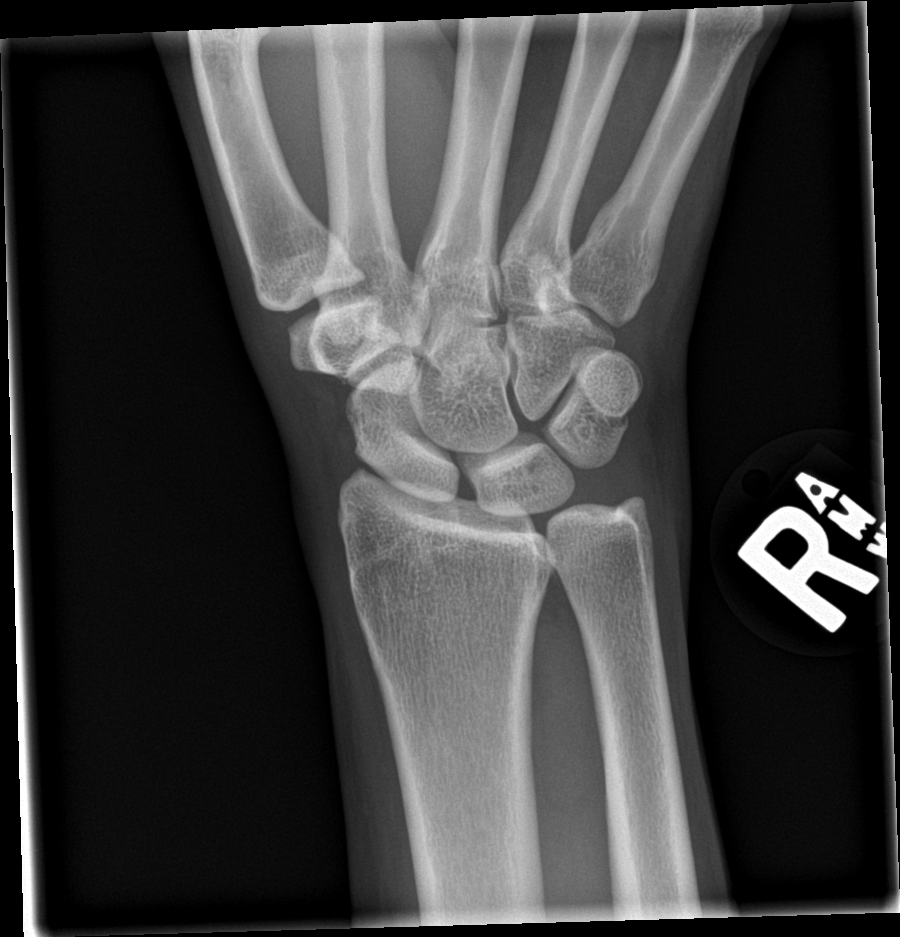

[wrist obl]
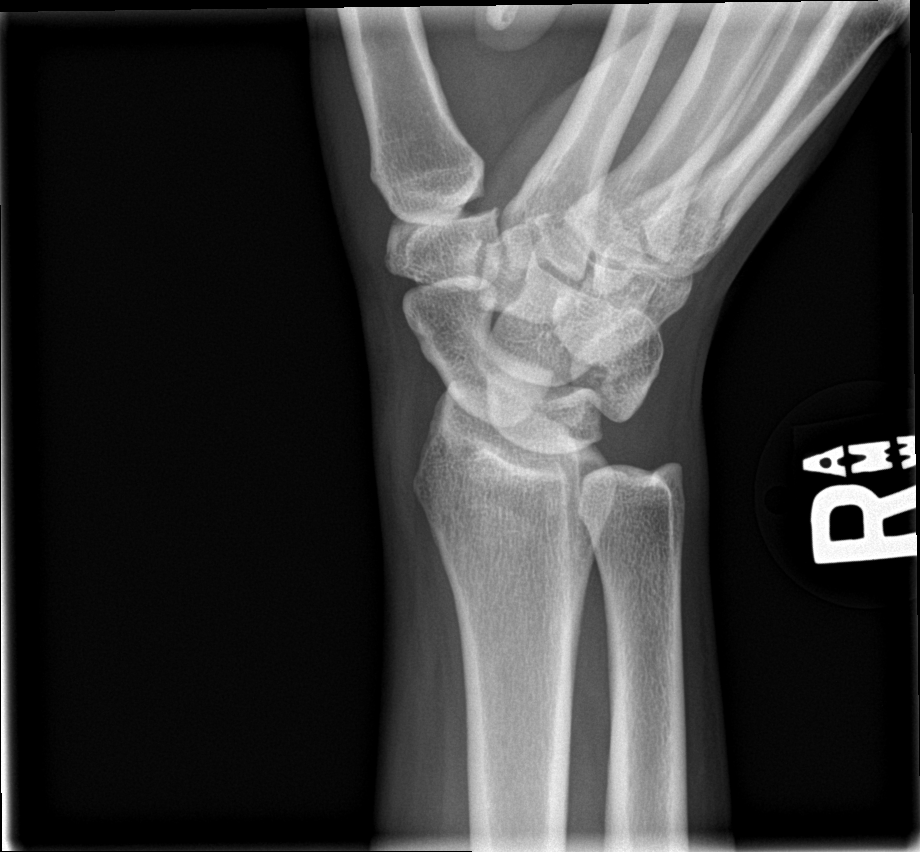

[wrist lat]
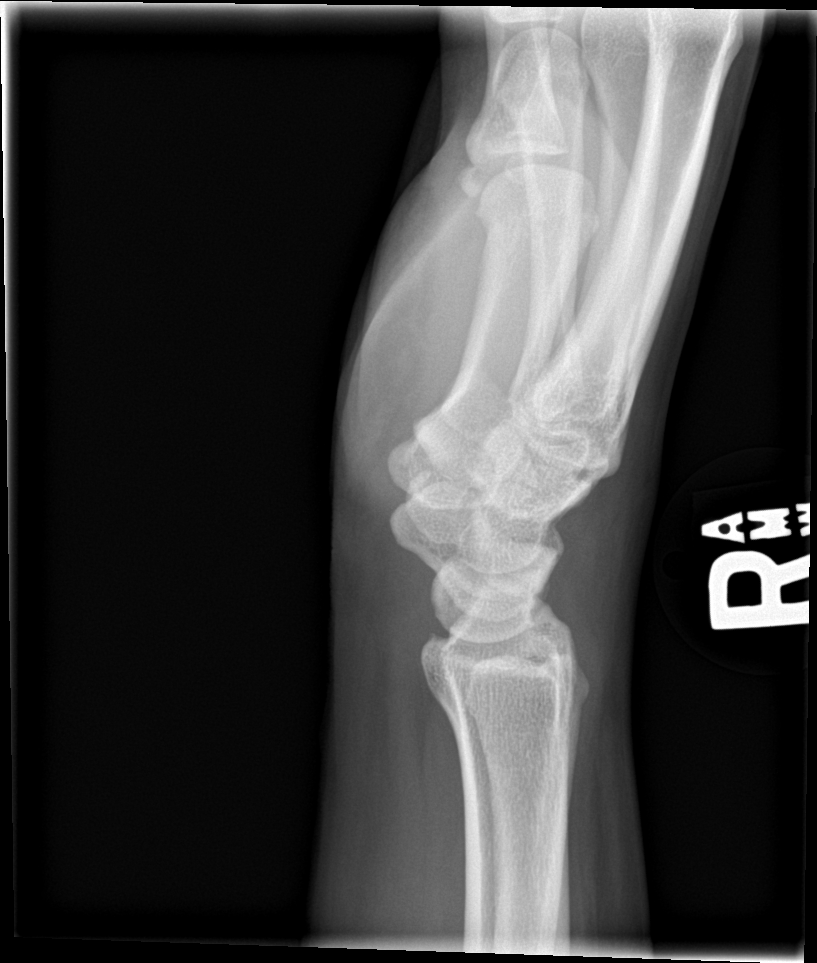

[wrist navicular]
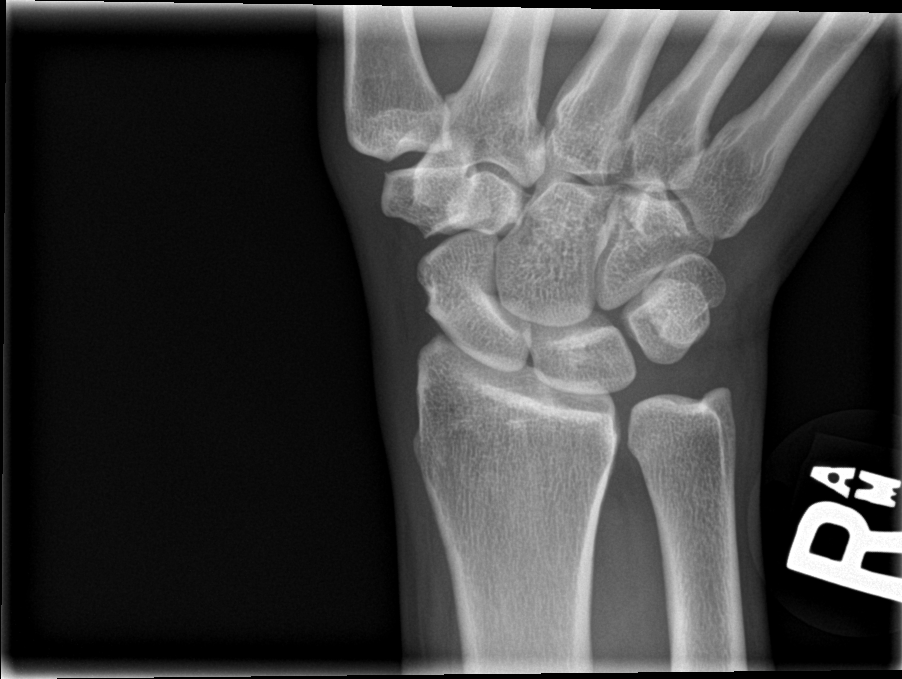

[4 of 4 positions shown; findings below may reference images not displayed]

FINDINGS: There is no evidence of fracture or dislocation. There is no
evidence of arthropathy or other focal bone abnormality. Soft
tissues are unremarkable.
IMPRESSION: Negative.

## 2021-02-10 DIAGNOSIS — Z1231 Encounter for screening mammogram for malignant neoplasm of breast: Secondary | ICD-10-CM | POA: Diagnosis not present

## 2021-02-10 DIAGNOSIS — Z01419 Encounter for gynecological examination (general) (routine) without abnormal findings: Secondary | ICD-10-CM | POA: Diagnosis not present

## 2021-02-10 DIAGNOSIS — Z6826 Body mass index (BMI) 26.0-26.9, adult: Secondary | ICD-10-CM | POA: Diagnosis not present

## 2021-08-14 DIAGNOSIS — H9312 Tinnitus, left ear: Secondary | ICD-10-CM | POA: Diagnosis not present

## 2021-08-14 DIAGNOSIS — H6123 Impacted cerumen, bilateral: Secondary | ICD-10-CM | POA: Diagnosis not present

## 2022-04-29 DIAGNOSIS — Z6826 Body mass index (BMI) 26.0-26.9, adult: Secondary | ICD-10-CM | POA: Diagnosis not present

## 2022-04-29 DIAGNOSIS — Z1231 Encounter for screening mammogram for malignant neoplasm of breast: Secondary | ICD-10-CM | POA: Diagnosis not present

## 2022-04-29 DIAGNOSIS — Z1272 Encounter for screening for malignant neoplasm of vagina: Secondary | ICD-10-CM | POA: Diagnosis not present

## 2022-04-29 DIAGNOSIS — N898 Other specified noninflammatory disorders of vagina: Secondary | ICD-10-CM | POA: Diagnosis not present

## 2022-04-29 DIAGNOSIS — R69 Illness, unspecified: Secondary | ICD-10-CM | POA: Diagnosis not present

## 2022-04-29 DIAGNOSIS — Z01419 Encounter for gynecological examination (general) (routine) without abnormal findings: Secondary | ICD-10-CM | POA: Diagnosis not present

## 2023-10-19 ENCOUNTER — Encounter (HOSPITAL_BASED_OUTPATIENT_CLINIC_OR_DEPARTMENT_OTHER): Payer: Self-pay | Admitting: Orthopedic Surgery

## 2023-10-19 ENCOUNTER — Other Ambulatory Visit: Payer: Self-pay | Admitting: Orthopedic Surgery

## 2023-10-19 ENCOUNTER — Other Ambulatory Visit: Payer: Self-pay

## 2023-10-20 ENCOUNTER — Ambulatory Visit (HOSPITAL_BASED_OUTPATIENT_CLINIC_OR_DEPARTMENT_OTHER)

## 2023-10-20 ENCOUNTER — Ambulatory Visit (HOSPITAL_BASED_OUTPATIENT_CLINIC_OR_DEPARTMENT_OTHER)
Admission: RE | Admit: 2023-10-20 | Discharge: 2023-10-20 | Disposition: A | Discharge status: Home / Self Care | Attending: Orthopedic Surgery | Admitting: Orthopedic Surgery

## 2023-10-20 ENCOUNTER — Encounter (HOSPITAL_BASED_OUTPATIENT_CLINIC_OR_DEPARTMENT_OTHER): Payer: Self-pay | Admitting: Orthopedic Surgery

## 2023-10-20 ENCOUNTER — Other Ambulatory Visit: Payer: Self-pay

## 2023-10-20 ENCOUNTER — Encounter (HOSPITAL_BASED_OUTPATIENT_CLINIC_OR_DEPARTMENT_OTHER)
Admission: RE | Disposition: A | Discharge status: Home / Self Care | Payer: Self-pay | Source: Home / Self Care | Attending: Orthopedic Surgery

## 2023-10-20 ENCOUNTER — Ambulatory Visit (HOSPITAL_BASED_OUTPATIENT_CLINIC_OR_DEPARTMENT_OTHER): Admitting: Anesthesiology

## 2023-10-20 DIAGNOSIS — D573 Sickle-cell trait: Secondary | ICD-10-CM | POA: Diagnosis not present

## 2023-10-20 DIAGNOSIS — S60552A Superficial foreign body of left hand, initial encounter: Secondary | ICD-10-CM | POA: Insufficient documentation

## 2023-10-20 DIAGNOSIS — W458XXA Other foreign body or object entering through skin, initial encounter: Secondary | ICD-10-CM | POA: Insufficient documentation

## 2023-10-20 DIAGNOSIS — Z01818 Encounter for other preprocedural examination: Secondary | ICD-10-CM

## 2023-10-20 HISTORY — DX: Anemia, unspecified: D64.9

## 2023-10-20 SURGERY — REMOVAL FOREIGN BODY EXTREMITY
Anesthesia: General | Site: Hand | Laterality: Left

## 2023-10-20 MED ORDER — CEFAZOLIN SODIUM-DEXTROSE 2-4 GM/100ML-% IV SOLN
2.0000 g | INTRAVENOUS | Status: AC
  Administered 2023-10-20: 2 g via INTRAVENOUS

## 2023-10-20 MED ORDER — OXYCODONE HCL 5 MG PO TABS: 5.0000 mg | ORAL_TABLET | Freq: Once | ORAL | Status: DC | PRN

## 2023-10-20 MED ORDER — ONDANSETRON HCL 4 MG/2ML IJ SOLN
INTRAMUSCULAR | Status: DC | PRN
  Administered 2023-10-20: 4 mg via INTRAVENOUS

## 2023-10-20 MED ORDER — MIDAZOLAM HCL 2 MG/2ML IJ SOLN
INTRAMUSCULAR | Status: DC | PRN
  Administered 2023-10-20: 2 mg via INTRAVENOUS

## 2023-10-20 MED ORDER — KETOROLAC TROMETHAMINE 30 MG/ML IJ SOLN
INTRAMUSCULAR | Status: DC | PRN
  Administered 2023-10-20: 30 mg via INTRAVENOUS

## 2023-10-20 MED ORDER — ACETAMINOPHEN 500 MG PO TABS: 1000.0000 mg | ORAL_TABLET | Freq: Once | ORAL | Status: DC

## 2023-10-20 MED ORDER — MIDAZOLAM HCL 2 MG/2ML IJ SOLN: 0.5000 mg | Freq: Once | INTRAMUSCULAR | Status: DC | PRN

## 2023-10-20 MED ORDER — SCOPOLAMINE 1 MG/3DAYS TD PT72
MEDICATED_PATCH | TRANSDERMAL | Status: AC
  Filled 2023-10-20: qty 1

## 2023-10-20 MED ORDER — ACETAMINOPHEN 500 MG PO TABS
ORAL_TABLET | ORAL | Status: AC
  Filled 2023-10-20: qty 2

## 2023-10-20 MED ORDER — DEXAMETHASONE SODIUM PHOSPHATE 4 MG/ML IJ SOLN
INTRAMUSCULAR | Status: DC | PRN
Start: 2023-10-20 — End: 2023-10-20
  Administered 2023-10-20: 10 mg via INTRAVENOUS

## 2023-10-20 MED ORDER — BUPIVACAINE HCL (PF) 0.25 % IJ SOLN
INTRAMUSCULAR | Status: DC | PRN
  Administered 2023-10-20: 9 mL

## 2023-10-20 MED ORDER — LIDOCAINE HCL (CARDIAC) PF 100 MG/5ML IV SOSY
PREFILLED_SYRINGE | INTRAVENOUS | Status: DC | PRN
  Administered 2023-10-20: 60 mg via INTRAVENOUS

## 2023-10-20 MED ORDER — OXYCODONE HCL 5 MG/5ML PO SOLN: 5.0000 mg | Freq: Once | ORAL | Status: DC | PRN

## 2023-10-20 MED ORDER — FENTANYL CITRATE (PF) 100 MCG/2ML IJ SOLN: 25.0000 ug | INTRAMUSCULAR | Status: DC | PRN

## 2023-10-20 MED ORDER — MEPERIDINE HCL 25 MG/ML IJ SOLN: 6.2500 mg | INTRAMUSCULAR | Status: DC | PRN

## 2023-10-20 MED ORDER — MIDAZOLAM HCL 2 MG/2ML IJ SOLN
INTRAMUSCULAR | Status: AC
  Filled 2023-10-20: qty 2

## 2023-10-20 MED ORDER — FENTANYL CITRATE (PF) 100 MCG/2ML IJ SOLN
INTRAMUSCULAR | Status: DC | PRN
  Administered 2023-10-20: 50 ug via INTRAVENOUS
  Administered 2023-10-20 (×2): 25 ug via INTRAVENOUS

## 2023-10-20 MED ORDER — SODIUM CHLORIDE (PF) 0.9 % IJ SOLN
INTRAMUSCULAR | Status: AC
  Filled 2023-10-20: qty 10

## 2023-10-20 MED ORDER — CEFAZOLIN SODIUM-DEXTROSE 2-4 GM/100ML-% IV SOLN
INTRAVENOUS | Status: AC
  Filled 2023-10-20: qty 100

## 2023-10-20 MED ORDER — LACTATED RINGERS IV SOLN: INTRAVENOUS | Status: DC

## 2023-10-20 MED ORDER — TRAMADOL HCL 50 MG PO TABS: 50.0000 mg | ORAL_TABLET | Freq: Four times a day (QID) | ORAL | 0 refills | Status: AC | PRN

## 2023-10-20 MED ORDER — PROPOFOL 10 MG/ML IV BOLUS
INTRAVENOUS | Status: DC | PRN
Start: 2023-10-20 — End: 2023-10-20
  Administered 2023-10-20: 200 mg via INTRAVENOUS

## 2023-10-20 MED ORDER — SCOPOLAMINE 1 MG/3DAYS TD PT72
1.0000 | MEDICATED_PATCH | TRANSDERMAL | Status: DC
  Administered 2023-10-20: 1.5 mg via TRANSDERMAL

## 2023-10-20 MED ORDER — FENTANYL CITRATE (PF) 100 MCG/2ML IJ SOLN
INTRAMUSCULAR | Status: AC
  Filled 2023-10-20: qty 2

## 2023-10-20 SURGICAL SUPPLY — 40 items
BLADE MINI RND TIP GREEN BEAV (BLADE) IMPLANT
BLADE SURG 15 STRL LF DISP TIS (BLADE) ×2 IMPLANT
BNDG COHESIVE 1X5 TAN STRL LF (GAUZE/BANDAGES/DRESSINGS) IMPLANT
BNDG ELASTIC 2INX 5YD STR LF (GAUZE/BANDAGES/DRESSINGS) IMPLANT
BNDG ELASTIC 3INX 5YD STR LF (GAUZE/BANDAGES/DRESSINGS) IMPLANT
BNDG ESMARK 4X9 LF (GAUZE/BANDAGES/DRESSINGS) IMPLANT
BNDG GAUZE DERMACEA FLUFF 4 (GAUZE/BANDAGES/DRESSINGS) IMPLANT
CHLORAPREP W/TINT 26 (MISCELLANEOUS) ×1 IMPLANT
CORD BIPOLAR FORCEPS 12FT (ELECTRODE) IMPLANT
COVER BACK TABLE 60X90IN (DRAPES) ×1 IMPLANT
COVER MAYO STAND STRL (DRAPES) ×1 IMPLANT
CUFF TOURN SGL QUICK 18X4 (TOURNIQUET CUFF) ×1 IMPLANT
DRAPE EXTREMITY T 121X128X90 (DISPOSABLE) ×1 IMPLANT
DRAPE OEC MINIVIEW 54X84 (DRAPES) IMPLANT
DRAPE SURG 17X23 STRL (DRAPES) ×1 IMPLANT
GAUZE SPONGE 4X4 12PLY STRL (GAUZE/BANDAGES/DRESSINGS) ×1 IMPLANT
GAUZE XEROFORM 1X8 LF (GAUZE/BANDAGES/DRESSINGS) ×1 IMPLANT
GLOVE BIO SURGEON STRL SZ7.5 (GLOVE) ×1 IMPLANT
GLOVE BIOGEL PI IND STRL 8 (GLOVE) ×1 IMPLANT
GOWN STRL REUS W/ TWL LRG LVL3 (GOWN DISPOSABLE) ×1 IMPLANT
GOWN STRL REUS W/TWL XL LVL3 (GOWN DISPOSABLE) ×1 IMPLANT
LOOP VASCLR MAXI BLUE 18IN ST (MISCELLANEOUS) IMPLANT
LOOPS VASCLR MAXI BLUE 18IN ST (MISCELLANEOUS) IMPLANT
NDL HYPO 25X1 1.5 SAFETY (NEEDLE) IMPLANT
NDL SAFETY ECLIPSE 18X1.5 (NEEDLE) IMPLANT
NEEDLE HYPO 25X1 1.5 SAFETY (NEEDLE) IMPLANT
NS IRRIG 1000ML POUR BTL (IV SOLUTION) ×1 IMPLANT
PACK BASIN DAY SURGERY FS (CUSTOM PROCEDURE TRAY) ×1 IMPLANT
PAD CAST 3X4 CTTN HI CHSV (CAST SUPPLIES) IMPLANT
PADDING CAST ABS COTTON 4X4 ST (CAST SUPPLIES) ×1 IMPLANT
SPLINT PLASTER CAST XFAST 3X15 (CAST SUPPLIES) IMPLANT
STOCKINETTE 4X48 STRL (DRAPES) ×1 IMPLANT
SUT ETHILON 3 0 PS 1 (SUTURE) IMPLANT
SUT ETHILON 4 0 PS 2 18 (SUTURE) IMPLANT
SWAB COLLECTION DEVICE MRSA (MISCELLANEOUS) IMPLANT
SWAB CULTURE ESWAB REG 1ML (MISCELLANEOUS) IMPLANT
SYR BULB EAR ULCER 3OZ GRN STR (SYRINGE) ×1 IMPLANT
SYR CONTROL 10ML LL (SYRINGE) IMPLANT
TOWEL GREEN STERILE FF (TOWEL DISPOSABLE) ×2 IMPLANT
UNDERPAD 30X36 HEAVY ABSORB (UNDERPADS AND DIAPERS) ×1 IMPLANT

## 2023-10-20 NOTE — Transfer of Care (Signed)
 Immediate Anesthesia Transfer of Care Note  Patient: Lauren Glenn  Procedure(s) Performed: REMOVAL FOREIGN BODY EXTREMITY (Left: Hand)  Patient Location: PACU  Anesthesia Type:General  Level of Consciousness: awake and patient cooperative  Airway & Oxygen Therapy: Patient Spontanous Breathing and Patient connected to nasal cannula oxygen  Post-op Assessment: Report given to RN and Post -op Vital signs reviewed and stable  Post vital signs: Reviewed and stable  Last Vitals:  Vitals Value Taken Time  BP 131/69 10/20/23 1442  Temp    Pulse 59 10/20/23 1444  Resp 15 10/20/23 1444  SpO2 98 % 10/20/23 1444  Vitals shown include unfiled device data.  Last Pain:  Vitals:   10/20/23 1058  TempSrc: Temporal  PainSc: 0-No pain      Patients Stated Pain Goal: 0 (10/20/23 1058)  Complications: No notable events documented.

## 2023-10-20 NOTE — Anesthesia Postprocedure Evaluation (Signed)
 Anesthesia Post Note  Patient: Lauren Glenn  Procedure(s) Performed: REMOVAL FOREIGN BODY EXTREMITY (Left: Hand)     Patient location during evaluation: PACU Anesthesia Type: General Level of consciousness: awake and alert, oriented and patient cooperative Pain management: pain level controlled Vital Signs Assessment: post-procedure vital signs reviewed and stable Respiratory status: spontaneous breathing, nonlabored ventilation and respiratory function stable Cardiovascular status: blood pressure returned to baseline and stable Postop Assessment: no apparent nausea or vomiting and able to ambulate Anesthetic complications: no   No notable events documented.  Last Vitals:  Vitals:   10/20/23 1515 10/20/23 1530  BP: 124/66 137/77  Pulse: (!) 55 (!) 59  Resp: 13 14  Temp:  (!) 36.3 C  SpO2: 93% 96%    Last Pain:  Vitals:   10/20/23 1530  TempSrc:   PainSc: 0-No pain                 Emonie Espericueta,E. Anayla Giannetti

## 2023-10-20 NOTE — H&P (Signed)
  Lauren Glenn is an 58 y.o. female.   Chief Complaint: foreign body HPI: 58 yo female with glass splinter in left palm from phone screen.  It is bothersome to her.  She wishes to have it removed.  Allergies: No Known Allergies  Past Medical History:  Diagnosis Date   Anemia     Past Surgical History:  Procedure Laterality Date   ABDOMINAL HYSTERECTOMY     APPENDECTOMY      Family History: Family History  Problem Relation Age of Onset   Cancer Other    CAD Neg Hx    Stroke Neg Hx    Diabetes Neg Hx     Social History:   reports that she has never smoked. She has never used smokeless tobacco. She reports that she does not drink alcohol and does not use drugs.  Medications: No medications prior to admission.    No results found for this or any previous visit (from the past 48 hours).  No results found.    Blood pressure 121/73, pulse 68, temperature (!) 97.3 F (36.3 C), temperature source Temporal, resp. rate 16, height 5\' 4"  (1.626 m), weight 72.8 kg, SpO2 99%.  General appearance: alert, cooperative, and appears stated age Head: Normocephalic, without obvious abnormality, atraumatic Neck: supple, symmetrical, trachea midline Extremities: Intact sensation and capillary refill all digits.  +epl/fpl/io.  No wounds.  Skin: Skin color, texture, turgor normal. No rashes or lesions Neurologic: Grossly normal Incision/Wound: none  Assessment/Plan Left palm retained foreign body.  Non operative and operative treatment options have been discussed with the patient and patient wishes to proceed with operative treatment. Risks, benefits, and alternatives of surgery have been discussed and the patient agrees with the plan of care.   Madysin Crisp 10/20/2023, 11:48 AM

## 2023-10-20 NOTE — Anesthesia Preprocedure Evaluation (Addendum)
 Anesthesia Evaluation  Patient identified by MRN, date of birth, ID band Patient awake    Reviewed: Allergy & Precautions, NPO status , Patient's Chart, lab work & pertinent test results  History of Anesthesia Complications Negative for: history of anesthetic complications  Airway Mallampati: I  TM Distance: >3 FB Neck ROM: Full    Dental  (+) Caps, Dental Advisory Given   Pulmonary    breath sounds clear to auscultation       Cardiovascular negative cardio ROS  Rhythm:Regular Rate:Normal     Neuro/Psych negative neurological ROS  negative psych ROS   GI/Hepatic negative GI ROS, Neg liver ROS,,,  Endo/Other  negative endocrine ROS    Renal/GU negative Renal ROS     Musculoskeletal   Abdominal   Peds  Hematology  (+) Blood dyscrasia, Sickle cell trait   Anesthesia Other Findings   Reproductive/Obstetrics                             Anesthesia Physical Anesthesia Plan  ASA: 1  Anesthesia Plan: General   Post-op Pain Management: Tylenol  PO (pre-op)*   Induction: Intravenous  PONV Risk Score and Plan: 3 and Ondansetron , Dexamethasone and Scopolamine patch - Pre-op  Airway Management Planned: LMA  Additional Equipment: None  Intra-op Plan:   Post-operative Plan:   Informed Consent: I have reviewed the patients History and Physical, chart, labs and discussed the procedure including the risks, benefits and alternatives for the proposed anesthesia with the patient or authorized representative who has indicated his/her understanding and acceptance.     Dental advisory given  Plan Discussed with: CRNA and Surgeon  Anesthesia Plan Comments:         Anesthesia Quick Evaluation

## 2023-10-20 NOTE — Discharge Instructions (Addendum)
 [facility] Instructions Hand Surgery  Wound Care: Keep your hand elevated above the level of your heart.  Do not allow it to dangle by your side.  Keep the dressing dry and do not remove it unless your doctor advises you to do so.  He will usually change it at the time of your post-op visit.  Moving your fingers is advised to stimulate circulation but will depend on the site of your surgery.  If you have a splint applied, your doctor will advise you regarding movement.  Activity: Do not drive or operate machinery today.  Rest today and then you may return to your normal activity and work as indicated by your physician.  Diet:  Drink liquids today or eat a light diet.  You may resume a regular diet tomorrow.    General expectations: Pain for two to three days. Fingers may become slightly swollen.  Call your doctor if any of the following occur: Severe pain not relieved by pain medication. Elevated temperature. Dressing soaked with blood. Inability to move fingers. White or bluish color to fingers.   No Ibuprofen until after 8:30pm today, if needed.   Post Anesthesia Home Care Instructions  Activity: Get plenty of rest for the remainder of the day. A responsible individual must stay with you for 24 hours following the procedure.  For the next 24 hours, DO NOT: -Drive a car -Advertising copywriter -Drink alcoholic beverages -Take any medication unless instructed by your physician -Make any legal decisions or sign important papers.  Meals: Start with liquid foods such as gelatin or soup. Progress to regular foods as tolerated. Avoid greasy, spicy, heavy foods. If nausea and/or vomiting occur, drink only clear liquids until the nausea and/or vomiting subsides. Call your physician if vomiting continues.  Special Instructions/Symptoms: Your throat may feel dry or sore from the anesthesia or the breathing tube placed in your throat during surgery. If this causes discomfort, gargle with  warm salt water. The discomfort should disappear within 24 hours.  If you had a scopolamine patch placed behind your ear for the management of post- operative nausea and/or vomiting:  1. The medication in the patch is effective for 72 hours, after which it should be removed.  Wrap patch in a tissue and discard in the trash. Wash hands thoroughly with soap and water. 2. You may remove the patch earlier than 72 hours if you experience unpleasant side effects which may include dry mouth, dizziness or visual disturbances. 3. Avoid touching the patch. Wash your hands with soap and water after contact with the patch.

## 2023-10-20 NOTE — Op Note (Signed)
 NAME: Lauren Glenn MEDICAL RECORD NO: [id number] DATE OF BIRTH: 27-Dec-1965 FACILITY: [facility] LOCATION: [facility] PHYSICIAN: Woodruff Skirvin R. Brenner Visconti, MD   OPERATIVE REPORT   DATE OF PROCEDURE: 10/20/23    PREOPERATIVE DIAGNOSIS: Foreign body left palm   POSTOPERATIVE DIAGNOSIS:.  Foreign body left palm   PROCEDURE: Removal deep foreign body left palm    SURGEON:  Brunilda Capra, M.D.   ASSISTANT: none   ANESTHESIA:  General   INTRAVENOUS FLUIDS:  Per anesthesia flow sheet.   ESTIMATED BLOOD LOSS:  Minimal.   COMPLICATIONS:  None.   SPECIMENS:  none   TOURNIQUET TIME:    Total Tourniquet Time Documented: Upper Arm (Left) - 15 minutes Total: Upper Arm (Left) - 15 minutes    DISPOSITION:  Stable to PACU.   INDICATIONS: 58 year old female with left palm foreign body from fall on broken cell phone screen.  She wishes to have this removed.  Risks, benefits and alternatives of surgery were discussed including the risks of blood loss, infection, damage to nerves, vessels, tendons, ligaments, bone for surgery, need for additional surgery, complications with wound healing, continued pain, stiffness, retained foreign body.  She voiced understanding of these risks and elected to proceed.  OPERATIVE COURSE:  After being identified preoperatively by myself,  the patient and I agreed on the procedure and site of the procedure.  The surgical site was marked.  Surgical consent had been signed. Preoperative IV antibiotic prophylaxis was given. She was transferred to the operating room and placed on the operating table in supine position with the Left upper extremity on an arm board.  General anesthesia was induced by the anesthesiologist.  Left upper extremity was prepped and draped in normal sterile orthopedic fashion.  A surgical pause was performed between the surgeons, anesthesia, and operating room staff and all were in agreement as to the patient, procedure, and site of procedure.   Tourniquet at the proximal aspect of the extremity was inflated to 250 mmHg after exsanguination of the arm with an Esmarch bandage.  C-arm was used to localize the foreign body.  Was made acutely palpable foreign body.  This was carried in subcutaneous tissue measuring technique.  The foreign body was fixation identified and removed.  It was a sliver of glass.  The area was palpated and there was some firm either tissue or foreign body remaining.  The wound was extended to visualize.  The thickened tissue was removed sharply with knife blade.  The area is palpated and no remaining foreign body or thickened tissue remained.  The wound was irrigated with sterile saline closed with 4-0 nylon in a horizontal mattress fashion.  Was injected with quarter percent plain Marcaine  to aid in postoperative analgesia.  Was dressed with sterile Xeroform 4 x 4's and wrapped with a Kerlix and Ace bandage.  The tourniquet was deflated at 15 minutes.  Fingertips were pink with brisk capillary refill after deflation of tourniquet.  The operative  drapes were broken down.  The patient was awoken from anesthesia safely.  She was transferred back to the stretcher and taken to PACU in stable condition.  I will see her back in the office in 1 week for postoperative followup.  I will give her a prescription for Tramadol  50 mg 1 tab PO q6 hours prn pain, dispense #15.   Lauren Rackham, MD Electronically signed, 10/20/23

## 2023-10-20 NOTE — Anesthesia Procedure Notes (Signed)
 Procedure Name: LMA Insertion Date/Time: 10/20/2023 1:58 PM  Performed by: Lonia Ro, CRNAPre-anesthesia Checklist: Patient identified, Emergency Drugs available, Suction available, Patient being monitored and Timeout performed Patient Re-evaluated:Patient Re-evaluated prior to induction Oxygen Delivery Method: Circle system utilized Preoxygenation: Pre-oxygenation with 100% oxygen Induction Type: IV induction Ventilation: Mask ventilation without difficulty LMA: LMA inserted LMA Size: 4.0 Number of attempts: 1 Placement Confirmation: positive ETCO2 and breath sounds checked- equal and bilateral Tube secured with: Tape Dental Injury: Teeth and Oropharynx as per pre-operative assessment

## 2023-10-21 ENCOUNTER — Encounter (HOSPITAL_BASED_OUTPATIENT_CLINIC_OR_DEPARTMENT_OTHER): Payer: Self-pay | Admitting: Orthopedic Surgery
